# Patient Record
Sex: Female | Born: 1940 | Race: White | Hispanic: No | Marital: Single | Smoking: Never smoker
Health system: Southern US, Community
[De-identification: ages and names within clinical notes are randomized; demographics above are authoritative.]

## PROBLEM LIST (undated history)

## (undated) DIAGNOSIS — J309 Allergic rhinitis, unspecified: Secondary | ICD-10-CM

## (undated) DIAGNOSIS — K219 Gastro-esophageal reflux disease without esophagitis: Secondary | ICD-10-CM

## (undated) DIAGNOSIS — K222 Esophageal obstruction: Secondary | ICD-10-CM

## (undated) DIAGNOSIS — K5792 Diverticulitis of intestine, part unspecified, without perforation or abscess without bleeding: Secondary | ICD-10-CM

## (undated) DIAGNOSIS — E785 Hyperlipidemia, unspecified: Secondary | ICD-10-CM

## (undated) DIAGNOSIS — K317 Polyp of stomach and duodenum: Secondary | ICD-10-CM

## (undated) DIAGNOSIS — K449 Diaphragmatic hernia without obstruction or gangrene: Secondary | ICD-10-CM

## (undated) DIAGNOSIS — K5790 Diverticulosis of intestine, part unspecified, without perforation or abscess without bleeding: Secondary | ICD-10-CM

## (undated) DIAGNOSIS — I059 Rheumatic mitral valve disease, unspecified: Secondary | ICD-10-CM

## (undated) HISTORY — DX: Diaphragmatic hernia without obstruction or gangrene: K44.9

## (undated) HISTORY — PX: NASAL SEPTUM SURGERY: SHX37

## (undated) HISTORY — PX: TONSILLECTOMY AND ADENOIDECTOMY: SUR1326

## (undated) HISTORY — DX: Allergic rhinitis, unspecified: J30.9

## (undated) HISTORY — DX: Polyp of stomach and duodenum: K31.7

## (undated) HISTORY — DX: Diverticulitis of intestine, part unspecified, without perforation or abscess without bleeding: K57.92

## (undated) HISTORY — PX: PARTIAL HYSTERECTOMY: SHX80

## (undated) HISTORY — DX: Rheumatic mitral valve disease, unspecified: I05.9

## (undated) HISTORY — DX: Diverticulosis of intestine, part unspecified, without perforation or abscess without bleeding: K57.90

## (undated) HISTORY — DX: Hyperlipidemia, unspecified: E78.5

## (undated) HISTORY — DX: Esophageal obstruction: K22.2

## (undated) HISTORY — DX: Gastro-esophageal reflux disease without esophagitis: K21.9

---

## 2000-10-18 ENCOUNTER — Encounter: Payer: Self-pay | Admitting: Family Medicine

## 2000-10-18 ENCOUNTER — Encounter: Admission: RE | Admit: 2000-10-18 | Discharge: 2000-10-18 | Payer: Self-pay | Admitting: Family Medicine

## 2002-11-25 ENCOUNTER — Encounter: Admission: RE | Admit: 2002-11-25 | Discharge: 2002-11-25 | Payer: Self-pay | Admitting: *Deleted

## 2002-11-25 ENCOUNTER — Encounter: Payer: Self-pay | Admitting: Family Medicine

## 2004-01-25 ENCOUNTER — Encounter: Admission: RE | Admit: 2004-01-25 | Discharge: 2004-01-25 | Payer: Self-pay | Admitting: Family Medicine

## 2005-01-25 ENCOUNTER — Encounter: Admission: RE | Admit: 2005-01-25 | Discharge: 2005-01-25 | Payer: Self-pay | Admitting: Family Medicine

## 2006-02-18 ENCOUNTER — Encounter: Admission: RE | Admit: 2006-02-18 | Discharge: 2006-02-18 | Payer: Self-pay | Admitting: Family Medicine

## 2007-03-12 ENCOUNTER — Encounter: Admission: RE | Admit: 2007-03-12 | Discharge: 2007-03-12 | Payer: Self-pay | Admitting: Family Medicine

## 2008-03-23 ENCOUNTER — Encounter: Admission: RE | Admit: 2008-03-23 | Discharge: 2008-03-23 | Payer: Self-pay | Admitting: Family Medicine

## 2009-05-03 ENCOUNTER — Encounter: Payer: Self-pay | Admitting: Internal Medicine

## 2009-10-10 ENCOUNTER — Ambulatory Visit: Payer: Self-pay | Admitting: Internal Medicine

## 2009-10-10 DIAGNOSIS — I059 Rheumatic mitral valve disease, unspecified: Secondary | ICD-10-CM | POA: Insufficient documentation

## 2009-10-10 DIAGNOSIS — R1013 Epigastric pain: Secondary | ICD-10-CM | POA: Insufficient documentation

## 2009-10-10 DIAGNOSIS — R1031 Right lower quadrant pain: Secondary | ICD-10-CM | POA: Insufficient documentation

## 2009-10-14 ENCOUNTER — Encounter: Payer: Self-pay | Admitting: Internal Medicine

## 2009-10-18 ENCOUNTER — Telehealth: Payer: Self-pay | Admitting: Internal Medicine

## 2009-10-25 ENCOUNTER — Ambulatory Visit: Payer: Self-pay | Admitting: Internal Medicine

## 2009-10-27 ENCOUNTER — Telehealth: Payer: Self-pay | Admitting: Internal Medicine

## 2009-10-27 DIAGNOSIS — K219 Gastro-esophageal reflux disease without esophagitis: Secondary | ICD-10-CM

## 2009-10-29 ENCOUNTER — Encounter: Payer: Self-pay | Admitting: Internal Medicine

## 2009-10-31 ENCOUNTER — Encounter: Payer: Self-pay | Admitting: Internal Medicine

## 2009-11-01 ENCOUNTER — Ambulatory Visit (HOSPITAL_COMMUNITY): Admission: RE | Admit: 2009-11-01 | Discharge: 2009-11-01 | Payer: Self-pay | Admitting: Internal Medicine

## 2009-11-07 ENCOUNTER — Encounter: Payer: Self-pay | Admitting: Internal Medicine

## 2009-12-07 DIAGNOSIS — E785 Hyperlipidemia, unspecified: Secondary | ICD-10-CM

## 2009-12-07 DIAGNOSIS — Z8719 Personal history of other diseases of the digestive system: Secondary | ICD-10-CM

## 2009-12-07 DIAGNOSIS — K5730 Diverticulosis of large intestine without perforation or abscess without bleeding: Secondary | ICD-10-CM | POA: Insufficient documentation

## 2009-12-07 DIAGNOSIS — K222 Esophageal obstruction: Secondary | ICD-10-CM

## 2009-12-07 DIAGNOSIS — K5900 Constipation, unspecified: Secondary | ICD-10-CM | POA: Insufficient documentation

## 2009-12-07 DIAGNOSIS — K449 Diaphragmatic hernia without obstruction or gangrene: Secondary | ICD-10-CM | POA: Insufficient documentation

## 2009-12-14 ENCOUNTER — Ambulatory Visit: Payer: Self-pay | Admitting: Internal Medicine

## 2010-01-26 IMAGING — CR DG UGI W/ HIGH DENSITY W/KUB
1 series · 1 of 1 positions shown · IV contrast (agent unspecified)
Comparison: None available.

CLINICAL DATA: Abdominal pain. GERD.

UPPER GI SERIES W/HIGH DENSITY W/KUB
TECHNIQUE: After obtaining a scout radiograph, upper GI series
performed with high density barium and effervescent agent. Thin
barium also used.
Fluoroscopy Time: 4.2 minutes.
Contrast: Barium.

[view not recorded]
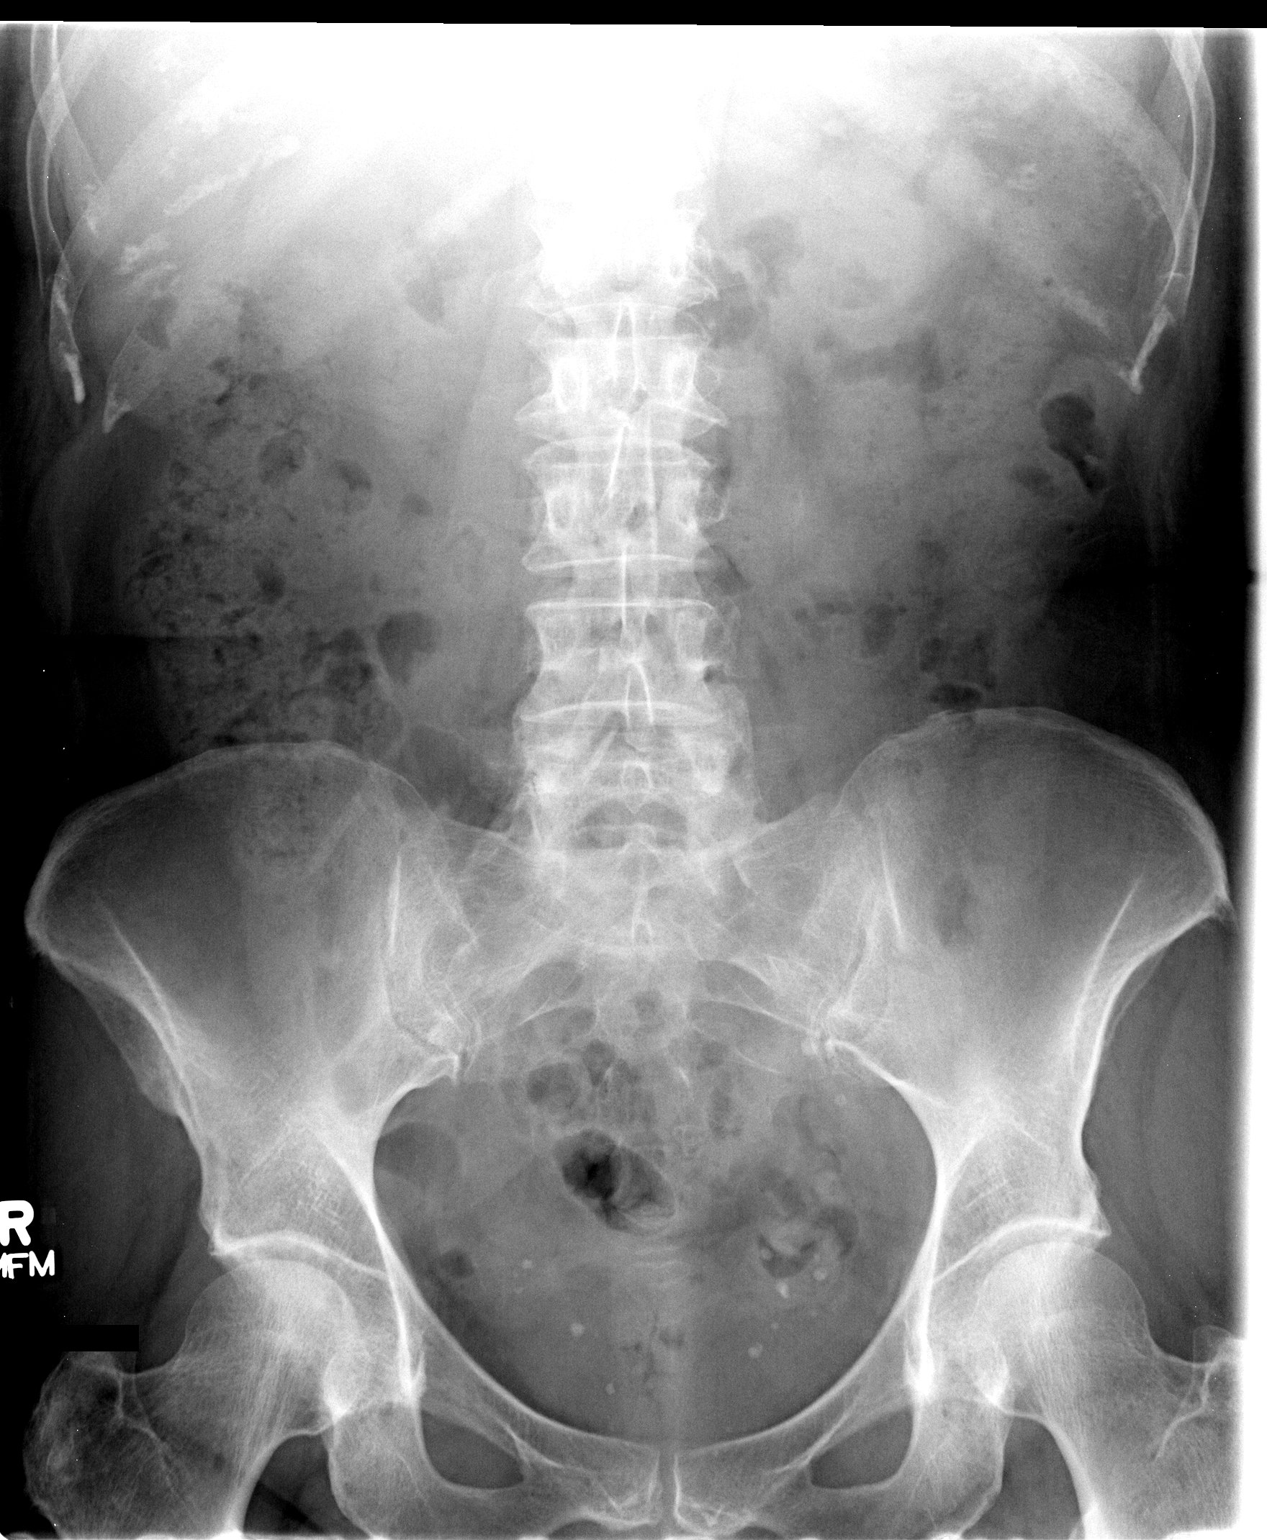

[1 of 1 positions shown; findings below may reference images not displayed]

FINDINGS: Scout film demonstrates a moderately large volume of
stool throughout the colon. No unexpected abdominal calcifications.

The esophagus appears normal without stricture, mass or evidence of
inflammatory change. Known esophageal polyps are not visualized.
Esophageal motility is normal. No hiatal hernia is seen. No
gastroesophageal reflux is elicited. 13 mm barium tablet passes
easily into the stomach. Stomach, duodenal bulb and sweep appear
normal.
IMPRESSION: Moderately large stool burden. Exam is otherwise negative.

## 2011-01-09 NOTE — Assessment & Plan Note (Signed)
Summary: F/U FROM BARIUM ESOPHAGRAM AND GERD           DEBORAH   History of Present Illness Visit Type: Follow-up Visit Primary GI MD: Lina Sar MD Primary Provider: Lucila Maine, MD Chief Complaint: F/U Bariium Esophagram & GERD has improved with medication/ diet History of Present Illness:   This is a 70 year old white female whom we have seen most recently for recurrent diverticulitis. She had a screening colonoscopy 10 years ago elsewhere apparently with findings of diverticulosis. A CT scan of the abdomen and pelvis done subsequest to new onset abdominal pain and fever confirmed inflammatory changes in the sigmoid colon. She was treated with 2 courses of Flagyl and Levaquin and subsequently Cipro with complete resolution of her symptoms. A repeat colonoscopy done in November 2010 showed mild diverticulosis in the sigmoid colon but was otherwise a normal examination. There was no evidence of acute diverticulitis and no obstruction.  Patient also had an abdominal ultrasound done in November 2010 which was negative. Patient has a history of significant gastroesophageal reflux treated with Nexium 40 mg in a.m. and Zantac 150 mg at bedtime. She underwent an upper endoscopy and esophageal dilation 9 years ago and was told to have a hiatal hernia. A repeat endoscopy done in 2010 again showed a hiatal hernia as well as multiple polyps in the fundus. Biopsies show mild chronic gastritis. There was no evidence of h. pylori, dysplasia or malignancy. Fundic gland polyps were benign. An upper GI with KUB done recently showed the esophagus to be normal without stricture, mass or evidence of inflammatory changes. Esophageal motility was normal and no hiatal hernia was seen. No gastroesophageal reflux was elicited and a 13 mm barium tablet passed easily into the stomach. The stomach, duodenal bulb and sweep appeared normal. There was a moderately large stool burden. The exam was otherwise negative. Patient comes  today for a follow up of her condition. She states that her symptoms have improved.    GI Review of Systems    Reports acid reflux.      Denies abdominal pain, belching, bloating, chest pain, dysphagia with liquids, dysphagia with solids, heartburn, loss of appetite, nausea, vomiting, vomiting blood, weight loss, and  weight gain.        Denies anal fissure, black tarry stools, change in bowel habit, constipation, diarrhea, diverticulosis, fecal incontinence, heme positive stool, hemorrhoids, irritable bowel syndrome, jaundice, light color stool, liver problems, rectal bleeding, and  rectal pain.    Current Medications (verified): 1)  Nexium 40 Mg Cpdr (Esomeprazole Magnesium) .... One Tablet By Mouth Once Daily 2)  Estrace 0.5 Mg Tabs (Estradiol) .... One Tablet By Mouth Once Daily 3)  Miacalcin 200 Unit/ml Soln (Calcitonin (Salmon)) .... One Spray Once Daily 4)  Valtrex 500 Mg Tabs (Valacyclovir Hcl) .... As Needed 5)  Red Yeast Rice 600 Mg Tabs (Red Yeast Rice Extract) .... 2 Tablets By Mouth Once Daily 6)  Citracal Plus  Tabs (Multiple Minerals-Vitamins) .... One Tablet By Mouth Two Times A Day 7)  Zantac 150 Mg Tabs (Ranitidine Hcl) .... One Tablet By Mouth At Bedtime 8)  Triple Flex Bone & Joint 750-600-100 Mg Tabs (Glucosamine-Chondroit-Calcium) .... One Tablet By Mouth Once Daily 9)  B Complex  Tabs (B Complex Vitamins) .... One Tablet By Mouth Once Daily 10)  Flax Seed Oil 1000 Mg Caps (Flaxseed (Linseed)) .... One Capsule By Mouth Once Daily 11)  Loratadine 10 Mg Tabs (Loratadine) .... One Tablet By Mouth Once Daily 12)  Probiotic Formula  Caps (Probiotic Product) .... One Capsule By Mouth Once Daily  Allergies (verified): 1)  ! Penicillin 2)  ! Erythromycin  Past History:  Past Medical History: Reviewed history from 12/07/2009 and no changes required. Diverticulosis Esophageal Stricture GERD Mitral valve prolapse  GASTRIC POLYP, HX OF (ICD-V12.79) HIATAL HERNIA  (ICD-553.3) CONSTIPATION (ICD-564.00) HYPERLIPIDEMIA (ICD-272.4) DIVERTICULOSIS OF COLON (ICD-562.10) Hx of ESOPHAGEAL STRICTURE (ICD-530.3) GERD (ICD-530.81) ABDOMINAL PAIN RIGHT LOWER QUADRANT (ICD-789.03) ABDOMINAL PAIN, EPIGASTRIC (ICD-789.06) MITRAL VALVE PROLAPSE (ICD-424.0)    Past Surgical History: Reviewed history from 12/07/2009 and no changes required. Hysterectomy-partial Tonsillectomy & Adnoidectomy Deviated septum repair  Family History: Reviewed history from 12/07/2009 and no changes required. Family History of Breast Cancer:Mother Family History of Prostate Cancer:Brother Family History of Liver Disease/Cirrhosis:Brother Family History of Heart Disease: Brother  Social History: Reviewed history from 10/10/2009 and no changes required. 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  Alcohol Use - yes Daily Caffeine Use Illicit Drug Use - no  Review of Systems       The patient complains of allergy/sinus, night sweats, sore throat, and urine leakage.         Pertinent positive and negative review of systems were noted in the above HPI. All other ROS was otherwise negative.   Vital Signs:  Patient profile:   70 year old female Height:      61 inches Weight:      138.13 pounds BMI:     26.19 Pulse rate:   60 / minute Pulse rhythm:   regular BP sitting:   110 / 66  (right arm) Cuff size:   regular  Vitals Entered By: June McMurray CMA Duncan Dull) (December 14, 2009 8:45 AM)  Physical Exam  General:  Well developed, well nourished, no acute distress. Eyes:  PERRLA, no icterus. Lungs:  Clear throughout to auscultation. Heart:  Regular rate and rhythm; no murmurs, rubs,  or bruits. Abdomen:  soft abdomen with minimal tenderness in right upper quadrant and normal liver edge at costal margin. Lower abdomen is unremarkable. There is no distention. Extremities:  No clubbing, cyanosis, edema or deformities noted. Skin:  Intact without significant  lesions or rashes. Psych:  Alert and cooperative. Normal mood and affect.   Impression & Recommendations:  Problem # 1:  GERD (ICD-530.81) Patient has chronic gastroesophageal reflux which is still somewhat symptomatic after a Nexium and Zantac regimen but much improved. Overall, the patient is satisfied with her progress. She will try to lose 10-15 lbs. We have discussed the posibility of a Nissen fundoplication. We have also discussed further gallbladder evaluation with a HIDA scan. I have offered her Reglan 5-10 mg at bedtime. She would like to first try to lose weight and continue her current regimen with dietary modifications. She will let us know if she needs further evaluation.  Problem # 2:  CONSTIPATION (ICD-564.00) Patient is status post recent colonoscopy on 10/25/09 which showed some mild diverticulosis. A recall colonoscopy will be due in 10 years.  Patient Instructions: 1)  low-fat diet. 2)  Antireflux measures. 3)  Nexium 40 mg at bedtime. 4)  Zantac 100 mg p.o. q.a.m. 5)  May take Nexium 40 mg twice a day if she has a flare up. 6)  Consider adding Reglan 5 mg at bedtime. 7)  Consider HIDA scan. 8)  Consider 24-hour pH probe for Nissen fundoplication. At this time, patient does not desire to do any more testing. 9)  Copy sent to : Dr Lucila Maine

## 2011-07-09 ENCOUNTER — Telehealth: Payer: Self-pay | Admitting: Internal Medicine

## 2011-07-09 NOTE — Telephone Encounter (Signed)
Left message for pt to call back  °

## 2011-07-09 NOTE — Telephone Encounter (Signed)
Pt states she has had several bouts with diverticulits. States she saw her PCP last week and had a CT scan and is on antibiotics now. Pt would like to be seen to see what she can do, what options she has. Pt scheduled to see Dr. Juanda Chance 07/23/11@10am . Pt aware of appt date and time. Called for records from Dr. Lucila Maine in [place], they are to fax records.

## 2011-07-23 ENCOUNTER — Encounter: Payer: Self-pay | Admitting: Internal Medicine

## 2011-07-23 ENCOUNTER — Ambulatory Visit (INDEPENDENT_AMBULATORY_CARE_PROVIDER_SITE_OTHER): Payer: Medicare Other | Admitting: Internal Medicine

## 2011-07-23 VITALS — BP 120/68 | HR 74 | Ht 61.0 in | Wt 139.0 lb

## 2011-07-23 DIAGNOSIS — K5732 Diverticulitis of large intestine without perforation or abscess without bleeding: Secondary | ICD-10-CM

## 2011-07-23 DIAGNOSIS — R933 Abnormal findings on diagnostic imaging of other parts of digestive tract: Secondary | ICD-10-CM

## 2011-07-23 NOTE — Progress Notes (Signed)
Kayla Cain 1941/01/20 MRN [id number]   History of Present Illness:  This is a 70 year old white female with recurrent diverticulitis. She has had several episodes a year for the past 3 years. This was documented on CT scans of the abdomen in 2010 which showed inflammatory changes in the left lower quadrant in the proximal sigmoid colon with inflamed diverticulum. A repeat CT scan 3 weeks ago showed minimal wall thickening of the sigmoid colon with adjacent fatty stranding consistent with diverticulitis. Her colonoscopy in November 2010 showed only mild diverticulosis. Her father had a colostomy after a perforated diverticulum. His brother died of diverticulitis; patient has had numerous courses of antibiotics. The most recent one was a19 day course of Cipro and Flagyl. She is improved but still having some pain in the lower abdomen. She denies fever, pneumaturia or rectal bleeding. She is having 6 small stools a day.   Past Medical History  Diagnosis Date  . Diverticulosis   . Esophageal stricture   . GERD (gastroesophageal reflux disease)   . Mitral valve problem   . Gastric polyp   . Hiatal hernia   . Hyperlipidemia   . Diverticulitis    Past Surgical History  Procedure Date  . Partial hysterectomy   . Tonsillectomy and adenoidectomy   . Nasal septum surgery     reports that she has never smoked. She has never used smokeless tobacco. She reports that she drinks alcohol. She reports that she does not use illicit drugs. family history includes Breast cancer in her mother; Heart disease in her brother; Liver disease in her brother; and Prostate cancer in her brother.  There is no history of Colon cancer. Allergies  Allergen Reactions  . Amitiza Nausea And Vomiting  . Erythromycin   . Macrolides And Ketolides     GI Intolerance  . Penicillins         Review of Systems: History of gastroesophageal reflux. Denies heartburn or dysphagia.  The remainder of the 10  point ROS is  negative except as outlined in H&P   Physical Exam: General appearance  Well developed in no distress. Eyes- non icteric. HEENT nontraumatic, normocephalic. Mouth no lesions, tongue papillated, no cheilosis. Neck supple without adenopathy, thyroid not enlarged, no carotid bruits, no JVD. Lungs Clear to auscultation bilaterally. Cor normal S1 normal S2, regular rhythm , no murmur,  quiet precordium. Abdomen soft but diffusely tender in the right and left lower quadrants. No rebound or fullness. Rectal: No stool in the rectum. Extremities no pedal edema. Skin no lesions. Neurological alert and oriented x 3. Psychological normal mood and affect.  Assessment and Plan:  Problem #25 70 year old white female with numerous bouts of lower abdominal pain identified as diverticulitis as per CT scans of the abdomen including the most recent scan 3 weeks ago. She has had numerous courses of antibiotics. She also has a family history of complicated diverticulitis. I think she is a good candidate for a sigmoid resection. We have discussed it and she agrees to see a Careers adviser. Interestingly, I saw only a few diverticuli in her sigmoid colon on colonoscopy.  We will obtain a barium enema to delineate her colon.  Clearly, her colon is not functioning. We will set up an appointment with a surgeon to discuss options.   07/23/2011 Lina Sar

## 2011-07-23 NOTE — Patient Instructions (Addendum)
You have been scheduled for a Barium Enema at [facility] on Thursday 07/26/11 @ 9:00 am. Please arrive at 8:30 am for registration. You should go to Outpatient Center for your test. Please make certain to go by [facility] Radiology Department in the next couple of days to get your prep and your instructions. If you need to reschedule, you may contact the hospital at [phone]. You have been scheduled for an appointment with Dr Michaell Cowing at [facility]. Your appointment is on 08/09/11 at 10:00 am. Please arrive at 10:00 am for registration. Make certain to bring a list of current medications, including any over the counter medications or vitamins. Also bring your co-pay if you have one as well as your insurance cards. [facility] is located at [address], [address]. Should you need to reschedule your appointment, please contact them at [phone]. CC: Dr Michaell Cowing, Dr Lucila Maine

## 2011-07-31 ENCOUNTER — Telehealth: Payer: Self-pay | Admitting: Internal Medicine

## 2011-07-31 NOTE — Telephone Encounter (Signed)
Results called to the pt  for BE

## 2011-08-02 ENCOUNTER — Encounter: Payer: Self-pay | Admitting: Internal Medicine

## 2011-08-03 ENCOUNTER — Telehealth (INDEPENDENT_AMBULATORY_CARE_PROVIDER_SITE_OTHER): Payer: Self-pay | Admitting: Surgery

## 2011-08-03 NOTE — Telephone Encounter (Signed)
This pt was called and instructed to pick up cd and she stated that someone from this office called her and told her that she would need the reports as well.  Kayla Cain said since we are on the same system you can pull the reports if you go under media.

## 2011-08-09 ENCOUNTER — Encounter (INDEPENDENT_AMBULATORY_CARE_PROVIDER_SITE_OTHER): Payer: Self-pay | Admitting: Surgery

## 2011-08-09 ENCOUNTER — Ambulatory Visit (INDEPENDENT_AMBULATORY_CARE_PROVIDER_SITE_OTHER): Payer: Medicare Other | Admitting: Surgery

## 2011-08-09 VITALS — BP 132/88 | HR 78 | Temp 98.0°F | Ht 61.0 in | Wt 137.2 lb

## 2011-08-09 DIAGNOSIS — Z8719 Personal history of other diseases of the digestive system: Secondary | ICD-10-CM

## 2011-08-09 DIAGNOSIS — K5730 Diverticulosis of large intestine without perforation or abscess without bleeding: Secondary | ICD-10-CM

## 2011-08-09 NOTE — Patient Instructions (Signed)
See Laparoscopic Colon & Diverticulitis Handouts

## 2011-08-09 NOTE — Progress Notes (Signed)
Subjective:     Patient ID: Kayla Cain, female   DOB: 26-Aug-1941, 70 y.o.   MRN: [id number]  HPI  Reason for visit: Recurrent diverticulitis. Consideration of partial colectomy.  Patient is a healthy woman with diverticulitis attacks for at least 3 years. This has been documented by CT scan at least once. She describes an attack as suprapubic pain that radiates to the right & left lower quadrants. It often goes to the back. Itoften happens if she is constipated. She has added fiber and Colace and her diet and has noted that her bowel movements are 2-3 times a day. She's never had a be admitted. The attacks have improved with oral antibiotics. However she is dwindling tolerance for the Cipro/Flagyl regimen. She's adjust her diet to a gluten-free diet. She notes that she feels less reflux and indigestion and gas, but she still has had attacks.  She is followed by Dr. Lina Sar. She's had endoscopies in the past which showed mild sigmoid diverticulosis only. No masses. Given the recurrent episodes of attacks, Dr. Juanda Chance recommended consideration of surgical resection. She comes today with her significant other. The patient notes that she had one female relative who required emergency surgery for diverticulitis and died. She another female relative that required emergency surgery with a colostomy and a colostomy takedown.  Past Medical History  Diagnosis Date  . Diverticulosis   . Esophageal stricture   . GERD (gastroesophageal reflux disease)   . Mitral valve problem   . Gastric polyp   . Hiatal hernia   . Hyperlipidemia   . Diverticulitis    Past Surgical History  Procedure Date  . Partial hysterectomy   . Tonsillectomy and adenoidectomy   . Nasal septum surgery    Current Outpatient Prescriptions on File Prior to Visit  Medication Sig Dispense Refill  . AMBULATORY NON FORMULARY MEDICATION Mega Red Krill 500 MG  One by mouth once daily       . calcitonin, salmon, (MIACALCIN) 200 UNIT/ACT  nasal spray Place 1 spray into the nose daily.        . Cholecalciferol (VITAMIN D) 1000 UNITS capsule Take 1,000 Units by mouth daily.        Tery Sanfilippo Sodium (COLACE PO) Take by mouth daily.        Marland Kitchen esomeprazole (NEXIUM) 40 MG capsule Take 40 mg by mouth daily before breakfast.        . estradiol (ESTRACE) 0.5 MG tablet Take 0.5 mg by mouth daily.        . fluticasone (FLONASE) 50 MCG/ACT nasal spray Place 1 spray into the nose daily.       Marland Kitchen loratadine (CLARITIN) 10 MG tablet Take 10 mg by mouth daily.        . Probiotic Product (PROBIOTIC FORMULA PO) Take 1 capsule by mouth daily.        . Red Yeast Rice Extract (RED YEAST RICE PO) Take 1 capsule by mouth daily.        . Wheat Dextrin (BENEFIBER) POWD Take by mouth daily.          Allergies  Allergen Reactions  . Amitiza Nausea And Vomiting  . Erythromycin   . Macrolides And Ketolides     GI Intolerance  . Penicillins     Review of Systems  Constitutional: Negative for fever, chills, diaphoresis, appetite change and fatigue.  HENT: Negative for ear pain, sore throat, trouble swallowing, neck pain and ear discharge.   Eyes: Negative for photophobia,  discharge and visual disturbance.  Respiratory: Negative for cough, choking, chest tightness and shortness of breath.   Cardiovascular: Negative for chest pain and palpitations.       Walk 1-2 miles without difficulty.  Gastrointestinal: Negative for nausea, vomiting, diarrhea, constipation, blood in stool, abdominal distention, anal bleeding and rectal pain.       See HPI  Genitourinary: Negative for dysuria, frequency, vaginal bleeding, vaginal discharge and difficulty urinating.  Musculoskeletal: Negative for myalgias and gait problem.  Skin: Negative for color change, pallor and rash.  Neurological: Negative for dizziness, speech difficulty, weakness and numbness.  Hematological: Negative for adenopathy.  Psychiatric/Behavioral: Negative for confusion and agitation. The patient  is not nervous/anxious.        Objective:   Physical Exam  Constitutional: She is oriented to person, place, and time. She appears well-developed and well-nourished. No distress.  HENT:  Head: Normocephalic.  Mouth/Throat: Oropharynx is clear and moist. No oropharyngeal exudate.  Eyes: Conjunctivae and EOM are normal. Pupils are equal, round, and reactive to light. No scleral icterus.  Neck: Normal range of motion. Neck supple. No tracheal deviation present.  Cardiovascular: Normal rate, regular rhythm and intact distal pulses.   Pulmonary/Chest: Effort normal and breath sounds normal. No respiratory distress. She exhibits no tenderness.  Abdominal: Soft. She exhibits no distension and no mass. There is no tenderness. There is no rebound and no guarding. Hernia confirmed negative in the right inguinal area and confirmed negative in the left inguinal area.  Genitourinary: No vaginal discharge found.  Musculoskeletal: Normal range of motion. She exhibits no tenderness.  Lymphadenopathy:    She has no cervical adenopathy.       Right: No inguinal adenopathy present.       Left: No inguinal adenopathy present.  Neurological: She is alert and oriented to person, place, and time. No cranial nerve deficit. She exhibits normal muscle tone. Coordination normal.  Skin: Skin is warm and dry. No rash noted. She is not diaphoretic. No erythema.  Psychiatric: She has a normal mood and affect. Her behavior is normal. Judgment and thought content normal.   Studies: Colonoscopy 2010, diverticulosis with no stricture  Barium enema Aug2012 showing sigmoid diverticulosis. Redundant sigmoid colon that reaches over towards the right side. No obvious intraluminal mass or stricture.    Assessment:     Recurrent sigmoid diverticulitis x3years, at least 3 attacks in the past year alone.    Plan:     Laparoscopic sigmoid colectomy:  The anatomy & physiology of the digestive tract was discussed.  The  pathophysiology was discussed.  Natural history risks without surgery was discussed.   I feel the risks of no intervention will lead to serious problems that outweigh the operative risks; therefore, I recommended a partial colectomy to remove the pathologic section of diverticuli.  Laparoscopic & open techniques were discussed. She is a good candidate for lap approach given BMI 25 and only 1 abdominal surgery.  Risks such as bleeding, infection, abscess, leak, reoperation, possible ostomy, hernia, heart attack, death, and other risks were discussed.  Goals of post-operative recovery were discussed as well.  We will work to minimize complications.  An educational handout on the pathology was given as well.  Questions were answered.  The patient & her significant other expressed understanding, desire to choose me as the surgeon, & wish to proceed with surgery.

## 2011-08-22 ENCOUNTER — Other Ambulatory Visit (INDEPENDENT_AMBULATORY_CARE_PROVIDER_SITE_OTHER): Payer: Self-pay | Admitting: Surgery

## 2011-08-22 ENCOUNTER — Encounter (HOSPITAL_COMMUNITY): Payer: Medicare Other

## 2011-08-22 LAB — CBC
Platelets: 308 10*3/uL (ref 150–400)
RBC: 4.56 MIL/uL (ref 3.87–5.11)
WBC: 8.7 10*3/uL (ref 4.0–10.5)

## 2011-08-22 LAB — COMPREHENSIVE METABOLIC PANEL
ALT: 18 U/L (ref 0–35)
AST: 21 U/L (ref 0–37)
CO2: 28 mEq/L (ref 19–32)
Chloride: 103 mEq/L (ref 96–112)
GFR calc non Af Amer: >60 mL/min (ref >60)
Sodium: 139 mEq/L (ref 135–145)
Total Bilirubin: 0.4 mg/dL (ref 0.3–1.2)

## 2011-08-28 ENCOUNTER — Inpatient Hospital Stay (HOSPITAL_COMMUNITY)
Admission: RE | Admit: 2011-08-28 | Discharge: 2011-08-31 | DRG: 331 | Disposition: A | Discharge status: Home / Self Care | Payer: Medicare Other | Source: Ambulatory Visit | Attending: Surgery | Admitting: Surgery

## 2011-08-28 ENCOUNTER — Other Ambulatory Visit (INDEPENDENT_AMBULATORY_CARE_PROVIDER_SITE_OTHER): Payer: Self-pay | Admitting: Surgery

## 2011-08-28 DIAGNOSIS — K5732 Diverticulitis of large intestine without perforation or abscess without bleeding: Principal | ICD-10-CM | POA: Diagnosis present

## 2011-08-28 DIAGNOSIS — I059 Rheumatic mitral valve disease, unspecified: Secondary | ICD-10-CM | POA: Diagnosis present

## 2011-08-28 DIAGNOSIS — K219 Gastro-esophageal reflux disease without esophagitis: Secondary | ICD-10-CM | POA: Diagnosis present

## 2011-08-28 DIAGNOSIS — Z01812 Encounter for preprocedural laboratory examination: Secondary | ICD-10-CM

## 2011-08-28 DIAGNOSIS — K449 Diaphragmatic hernia without obstruction or gangrene: Secondary | ICD-10-CM | POA: Diagnosis present

## 2011-08-28 HISTORY — PX: COLON SURGERY: SHX602

## 2011-08-28 LAB — TYPE AND SCREEN: ABO/RH(D): O POS

## 2011-08-28 LAB — ABO/RH: ABO/RH(D): O POS

## 2011-08-29 LAB — CREATININE, SERUM
Creatinine, Ser: 0.47 mg/dL — ABNORMAL LOW (ref 0.50–1.10)
GFR calc Af Amer: >60 mL/min (ref >60)
GFR calc non Af Amer: >60 mL/min (ref >60)

## 2011-08-29 LAB — CBC
HCT: 35.6 % — ABNORMAL LOW (ref 36.0–46.0)
RDW: 12.8 % (ref 11.5–15.5)
WBC: 13.6 10*3/uL — ABNORMAL HIGH (ref 4.0–10.5)

## 2011-08-31 NOTE — Op Note (Signed)
Kayla Cain, TENCH NO.:  [id number]  MEDICAL RECORD NO.:  [id number]  LOCATION:  1528                         FACILITY:  [facility]  PHYSICIAN:  Ardeth Sportsman, MD     DATE OF BIRTH:  1941-06-25  DATE OF PROCEDURE:  08/28/2011 DATE OF DISCHARGE:                              OPERATIVE REPORT   PRIMARY CARE PHYSICIAN:  Lucila Maine, MD.  GASTROENTEROLOGY:  Hedwig Morton. Juanda Chance, MD  SURGEON:  Ardeth Sportsman, MD.  ASSISTANT:  RN  PREOPERATIVE DIAGNOSIS:  Recurrent sigmoid diverticulitis.  POSTOPERATIVE DIAGNOSIS:  Recurrent sigmoid diverticulitis.  PROCEDURE PERFORMED: 1. Laparoscopic-assisted sigmoid colectomy. 2. Biopsy of splenic flexure mobilization. 3. Rigid proctoscopy.  ANESTHESIA: 1. General anesthesia. 2. Local anesthetic and field blocks.  SPECIMENS:  Sigmoid colon.  Open end is proximal.  Anastomotic rings. Blue Prolene stitches in proximal ring.  DRAINS:  None.  ESTIMATED BLOOD LOSS:  50 mL.  COMPLICATIONS:  None.  MAJOR INDICATIONS:  [name] is a pleasant 70 year old female who has had incidence of recurrent diverticulitis episodes especially in the past 3 years.  This has been documented on a CT scan.  She has been followed up Dr. Juanda Chance with this.  She is on diet modifications.  She has had better control of her bowel function as well.  She has have recurrent episodes.  She has had 3 attacks in the past year, during this past year as well.  Because of the recurring attacks, she was sent to [place] for consideration of a segmental resection.  The patient recalls at least 6 attacks.  The physiology of the digestive tract was explained.  The physiology of diverticulitis with long term risks of stricture perforation, abscess, emergency surgery, and plans were discussed.  After discussion, offer was made for partial colectomy.  Laparoscopic operative techniques were discussed.  Risks, benefits, and alternatives were discussed.   Questions answered and she agreed to proceed.  OPERATIVE FINDINGS:  She had a mildly tortuous sigmoid colon.  She had moderate sigmoid mesentery thickening consistent with mesenteric side diverticulitis.  There was no obvious cancer, tumor or metastatic disease.  DESCRIPTION OF PROCEDURE:  Informed consent was confirmed.  The patient underwent general anesthesia without any difficulty.  She was positioned in low lithotomy with the arms tucked.  She had a Foley catheter sterilely placed.  She had sequential compression devices given active during the entire case.  She had subcutaneous heparin given preoperatively.  She was placed on the anti-ileus protocol.  Her abdomen was prepped and draped in sterile fashion.  Surgical time-out confirmed our plan.  I placed a #5 mm port in the right upper quadrant using optical entry technique with the patient in steep reverse Trendelenburg and right side up.  Entry was clean.  I induced carbon dioxide insufflation.  Camera inspection revealed no injury.  Under direct visualization, I placed 5-0 ports through the umbilicus in the right lower quadrant.  Camera inspection noted a mildly tortuous sigmoid colon down in the pelvis.  I was able to reflect the small bowel out of the pelvis.  I elevated the sigmoid mesentry.  It was mildly thickened on the antimesenteric side.  I  scored the mesentry from the midrectum up towards the ligament of Treitz.  I was able to elevate the sigmoid mesentery anteriorly and get to the retromesenteric space.  I could see the left ureter and go down the vessels and kept those posterior.  I did further dissection and mobilize the medial and lateral fascia of the left colon.  I came down to skeletonize the mesentery down towards the inferior mesenteric artery, but I did not ligate it.  Next, I mobilized the colon in a lateral to medial fashion.  I started to release the sigmoid mesentery off the adhesions over the  pelvic brim. I was able to come up and mobilize up to the splenic flexure.  Again, I was able to mobilize laterally on the left distal sigmoid colon and proximal rectum.  I  could see the ureter and kept medial to those, taken away from this.  I avoided dissection on the lateral stalk. Hemostasis was good.  I made a 6 cm incision in her prior Pfannenstiel incision and a wound protector there.  I can find the rectosigmoid junction and made a window there.  I transected the sigmoid colon off the rectum using a Contour stapler.  I took the proximal mesorectum medially towards the sigmoid so I can eviscerate up towards descending colon.  At the descending/sigmoid colon junction, the area was more soft.  She had mild to moderately thickened sigmoid mesentery.  This seemed consistent more with diverticulitis that was more in the mesenteric side than the antimesenteric side.  When it ended small bowel clamping was done proximally.  I transected at the sigmoid/descending junction using a scalpel to healthy bleeding mucosa and good healthy mesentery applying good blood supply.  I did try to stay close to the sigmoid colon and not take too much mesentery.  I could see the ureter and stayed away from this at this time.  I used sizers and the 33 EEA sizer easily fit through.  I placed an anvil of 33 EEA stapler in the descending colon side and closed around the anvil using a 0-Prolene purse-string stitch and tied that down.  I scrubbed down and did general anal dilation with fingers in the EEA size until I could place the EEA stapler up.  I could see the vaginal cuff and was able to bring the rectal stump posterior to that.  I brought the spike out anterior to the stapler line on the antimesenteric side.  I attached the anvil to spike the stapler and clamped that down. I checked to make sure there was no involvement of any contagious sources.  We were cephalad to the vaginal cuff and that was  not involved.  I held the stapler clamp for 45 seconds.  I fired and held it for 30 seconds and released it.  I had 2 excellent anastomotic rings.  I did a rigid proctoscopy.  I could see the anastomotic line at 13 cm from the anal verge.  Hemostasis was excellent.  I did insufflation with the descending colon clamped proximally under water. There was no leaking of bubbles.  That was consistent with a negative air-leak test. I rechecked the hemostasis, it was good in the rectum itself.  I desufflated the colon and rectum.  I scrubbed and looked back at the abdomen.  Hemostasis was good in the pelvis.  I clamped down on the wound protector and did diagnostic laparoscopy.  With the patient head down, there was some mild tension  at the anastomosis.  I mobilized the splenic flexure and the colon turning the distal transverse colon and freeing the greater omentum off the distal transverse colon and coming around on the posterior attachments in the posterior splenocolic attachment until that came down well.  I freed off some attachments to the left spleen as well.  There was some mild tension at the IMA and IMV, but the bowel lumen itself did not have any tension at the anastomosis and looked much softer.  Hemostasis was excellent and the anastomosis was viable.  The small bowel otherwise looked fine.  I did irrigation of the pelvis with clear return.  I evacuated the perineum and removed the 5 mm ports.  I closed the Pfannenstiel wound using 0 Vicryl stitches on the posterior rectus fascia in the midline and the anterior rectus fascia transversely using #1 loop PDS.  I closed the skin using 4-0 Monocryl stitches.  I placed some umbilical tape wicks in the Pfannenstiel wound with dry sterile dressing.  The patient was extubated, returned to the recovery room in stable condition.  I discussed postop goals with the patient.  I discussed postop findings with the patient's  family.     Ardeth Sportsman, MD     SCG/MEDQ  D:  08/28/2011  T:  08/28/2011  Job:  [id number]  cc:   Lucila Maine, MD Fax: [phone]  Hedwig Morton. Juanda Chance, MD [address] [place] [address]  Electronically Signed by Karie Soda MD on 08/31/2011 02:20:10 PM

## 2011-09-03 ENCOUNTER — Telehealth (INDEPENDENT_AMBULATORY_CARE_PROVIDER_SITE_OTHER): Payer: Self-pay

## 2011-09-03 NOTE — Telephone Encounter (Signed)
Patient called about her incision that has blisters around it where the tape was.  Dr Michaell Cowing drained them in the hospital.  She is still keeping it covered at times but the bandage sticks to it.  After consulting with Ethlyn Gallery, we advised she try a little Neosporin and a non stick pad and see how that works.

## 2011-09-05 NOTE — Discharge Summary (Signed)
  Kayla Cain, JANUARY NO.:  [id number]  MEDICAL RECORD NO.:  [id number]  LOCATION:  1528                         FACILITY:  [facility]  PHYSICIAN:  Ardeth Sportsman, MD     DATE OF BIRTH:  1941/11/18  DATE OF ADMISSION:  08/28/2011 DATE OF DISCHARGE:  08/31/2011                              DISCHARGE SUMMARY   PRIMARY CARE PHYSICIAN:  Lucila Maine, MD.  GASTROENTEROLOGIST:  Hedwig Morton. Juanda Chance, MD.  SURGEON:  Ardeth Sportsman, MD.  PRINCIPAL FINAL DISCHARGE DIAGNOSIS:  Recurrent sigmoid diverticulitis.  PROCEDURE PERFORMED:  Laparoscopic assisted splenic flexure mobilization and sigmoid colectomy with rigid proctoscopy on August 28, 2011.  OTHER DIAGNOSES: 1. Gastroesophageal reflux disease with esophageal strictures. 2. Mitral valve prolapse. 3. Gastric polyp. 4. Hiatal hernia. 5. Hyperlipidemia. 6. Status post partial hysterectomy. 7. Status post tonsillectomy. 8. Adenoidectomy in the past. 9. Nasal symptom surgery.  MEDICATIONS:  Noted in the system include: 1. __________ 500 mg daily. 2. Miacalcin 200 units nasal spray daily. 3. Vitamin D 1000 units daily. 4. Colace daily. 5. Nexium 40 mg daily. 6. Estradiol 0.5 mg p.o. daily. 7. Flonase 1 spray daily. 8. Loratadine 10 mg daily p.r.n. 9. Red yeast rice  __________ and Benefiber daily.  Pathology shows diverticulosis.  No cancer or tumor.  HOSPITAL COURSE:  [name] is a 70 year old female who has struggled with numerous diverticulitis attacks especially in the past 3 years. She has had 3 in 2012.  She was sent to me by Dr. Juanda Chance for the recurrent attacks.  She underwent laparoscopic-assisted resection.  She was placed on the anti-ileus protocol.  She was started on sips of liquid and by the time of discharge was tolerating solid foods and liquids.  She had had a bowel movement.  She had flatus.  Her pain was controlled with oral pain medications.  She was walking well in the hallways.  Based  on improvements I think it would be reasonable for her to be discharged home with the following instructions: 1. She is to return to clinic to see me in 1 to 2 weeks. 2. She should call if she has any fever, chills, sweat, nausea,     vomiting, uncontrolled pain, diarrhea, bleeding, blistering,     drainage from her incision or other concerns. 3. She should resume her home medications as noted above. 4. She should use ice pack, heating pads, warm showers, Tylenol over-     the-counter p.r.n., Aleve over-the-counter p.r.n., and oxycodone 5     to 10 mg p.o. q. 4 h. p.r.n. pain in combination to help control     her pain. 5. She should use some type of bowel regimen whether to continue her     Benefiber versus switching to MiraLax, which we had discussed.  The     goal is one soft bowel movement a day.     Ardeth Sportsman, MD     SCG/MEDQ  D:  08/31/2011  T:  08/31/2011  Job:  [id number]  Electronically Signed by Karie Soda MD on 09/05/2011 08:39:48 AM

## 2011-09-12 ENCOUNTER — Encounter (INDEPENDENT_AMBULATORY_CARE_PROVIDER_SITE_OTHER): Payer: Self-pay | Admitting: Surgery

## 2011-09-12 ENCOUNTER — Ambulatory Visit (INDEPENDENT_AMBULATORY_CARE_PROVIDER_SITE_OTHER): Payer: Medicare Other | Admitting: Surgery

## 2011-09-12 DIAGNOSIS — K5730 Diverticulosis of large intestine without perforation or abscess without bleeding: Secondary | ICD-10-CM

## 2011-09-12 NOTE — Progress Notes (Signed)
Subjective:     Patient ID: Kayla Cain, female   DOB: [birth year], 70 y.o.   MRN: [id number]  HPI  Patient Care Team: Heywood Bene as PCP - General (Unknown Physician Specialty) Hart Carwin, MD as Consulting Physician (Gastroenterology)  This patient is a 70 y.o.female who presents today for surgical evaluation.   Diagnosis: Recurrent diverticulitis  Procedure: Laparoscopically assisted sigmoid colectomy 08/28/2011  Pathology: Diverticulosis. No cancer.  The patient comes in today feeling quite well. Minimal soreness at the suprapubic Pfannenstiel incision. Eating well.  2-3 bowel movements a day. No fevers chills or sweats. Energy improving. She comes today with her husband. She is wanting to be more active. She is walking well. No drainage from her incision.   Past Medical History  Diagnosis Date  . Diverticulosis   . Esophageal stricture   . GERD (gastroesophageal reflux disease)   . Mitral valve problem     Followed by Dr. Myrtis Ser briefly.  Did not tolertate B blocker.  Murmur disappeared & now off cardiac meds w/o symptoms  . Gastric polyp   . Hiatal hernia   . Hyperlipidemia   . Diverticulitis     Past Surgical History  Procedure Date  . Partial hysterectomy   . Tonsillectomy and adenoidectomy   . Nasal septum surgery   . Colon surgery 08/28/11    sigmoid colectomy -diverticulitis    History   Social History  . Marital Status: Single    Spouse Name: N/A    Number of Children: N/A  . Years of Education: N/A   Occupational History  . [employer]     Social History Main Topics  . Smoking status: Never Smoker   . Smokeless tobacco: Never Used  . Alcohol Use: Yes     rare   . Drug Use: No  . Sexually Active: Not on file   Other Topics Concern  . Not on file   Social History Narrative  . No narrative on file    Family History  Problem Relation Age of Onset  . Breast cancer Mother   . Cancer Mother     breast  . Prostate cancer Brother   . Liver  disease Brother   . Heart disease Brother   . Colon cancer Neg Hx     Current outpatient prescriptions:AMBULATORY NON FORMULARY MEDICATION, Mega Red Krill 500 MG  One by mouth once daily , Disp: , Rfl: ;  calcitonin, salmon, (MIACALCIN) 200 UNIT/ACT nasal spray, Place 1 spray into the nose daily.  , Disp: , Rfl: ;  Cholecalciferol (VITAMIN D) 1000 UNITS capsule, Take 1,000 Units by mouth daily.  , Disp: , Rfl: ;  Docusate Sodium (COLACE PO), Take by mouth daily.  , Disp: , Rfl:  esomeprazole (NEXIUM) 40 MG capsule, Take 40 mg by mouth daily before breakfast.  , Disp: , Rfl: ;  estradiol (ESTRACE) 0.5 MG tablet, Take 0.5 mg by mouth daily.  , Disp: , Rfl: ;  fluticasone (FLONASE) 50 MCG/ACT nasal spray, Place 1 spray into the nose daily. , Disp: , Rfl: ;  loratadine (CLARITIN) 10 MG tablet, Take 10 mg by mouth daily.  , Disp: , Rfl: ;  Probiotic Product (PROBIOTIC FORMULA PO), Take 1 capsule by mouth daily.  , Disp: , Rfl:  Red Yeast Rice Extract (RED YEAST RICE PO), Take 1 capsule by mouth daily.  , Disp: , Rfl: ;  Wheat Dextrin (BENEFIBER) POWD, Take by mouth daily.  , Disp: , Rfl:  Allergies  Allergen Reactions  . Amitiza Nausea And Vomiting  . Erythromycin   . Macrolides And Ketolides     GI Intolerance  . Penicillins        Review of Systems  Constitutional: Negative for fever, chills, diaphoresis, appetite change and fatigue.  HENT: Negative for ear pain, sore throat, trouble swallowing, neck pain and ear discharge.   Eyes: Negative for photophobia, discharge and visual disturbance.  Respiratory: Negative for cough, choking, chest tightness and shortness of breath.   Cardiovascular: Negative for chest pain and palpitations.  Gastrointestinal: Positive for abdominal pain. Negative for nausea, vomiting, diarrhea, constipation, blood in stool, abdominal distention, anal bleeding and rectal pain.  Genitourinary: Negative for dysuria, frequency and difficulty urinating.    Musculoskeletal: Negative for myalgias and gait problem.  Skin: Negative for color change, pallor and rash.  Neurological: Negative for dizziness, speech difficulty, weakness and numbness.  Hematological: Negative for adenopathy.  Psychiatric/Behavioral: Negative for confusion and agitation. The patient is not nervous/anxious.        Objective:   Physical Exam  Constitutional: She is oriented to person, place, and time. She appears well-developed and well-nourished. No distress.  HENT:  Head: Normocephalic.  Mouth/Throat: Oropharynx is clear and moist. No oropharyngeal exudate.  Eyes: Conjunctivae and EOM are normal. Pupils are equal, round, and reactive to light. No scleral icterus.  Neck: Normal range of motion. Neck supple. No tracheal deviation present.  Cardiovascular: Normal rate, regular rhythm and intact distal pulses.   Pulmonary/Chest: Effort normal and breath sounds normal. No respiratory distress. She exhibits no tenderness.  Abdominal: Soft. She exhibits no distension and no mass. There is no tenderness. There is no guarding. Hernia confirmed negative in the right inguinal area and confirmed negative in the left inguinal area.       Incisions clean with normal healing ridges.  No hernias  Musculoskeletal: Normal range of motion. She exhibits no tenderness.  Lymphadenopathy:    She has no cervical adenopathy.       Right: No inguinal adenopathy present.       Left: No inguinal adenopathy present.  Neurological: She is alert and oriented to person, place, and time. No cranial nerve deficit. She exhibits normal muscle tone. Coordination normal.  Skin: Skin is warm and dry. No rash noted. She is not diaphoretic. No erythema.  Psychiatric: She has a normal mood and affect. Her behavior is normal. Judgment and thought content normal.       Assessment:     POD# 15 lap sigmoid colectomy, recovering well    Plan:     i noted I think she is doing amazingly well for only 2  weeks out from colon surgery. She and her husband feel reassured.  Increase activity as tolerated.  Do not push through pain.  Advanced on diet as tolerated. Bowel regimen to avoid problems.Try to avoid foods that triggered prior attacks. She withdrew well to avoid raw particulate matter getting into the colon and causing further problems.  Return to clinic 3 weeks. The patient expressed understanding and appreciation.

## 2011-10-03 ENCOUNTER — Ambulatory Visit (INDEPENDENT_AMBULATORY_CARE_PROVIDER_SITE_OTHER): Payer: Medicare Other | Admitting: Surgery

## 2011-10-03 ENCOUNTER — Encounter (INDEPENDENT_AMBULATORY_CARE_PROVIDER_SITE_OTHER): Payer: Self-pay | Admitting: Surgery

## 2011-10-03 VITALS — BP 116/76 | HR 68 | Temp 96.9°F | Resp 14 | Ht 61.0 in | Wt 134.8 lb

## 2011-10-03 DIAGNOSIS — K5730 Diverticulosis of large intestine without perforation or abscess without bleeding: Secondary | ICD-10-CM

## 2011-10-03 NOTE — Patient Instructions (Signed)

## 2011-10-03 NOTE — Progress Notes (Signed)
Subjective:     Patient ID: Kayla Cain, female   DOB: May 30, 1941, 70 y.o.   MRN: [id number]  HPI  Patient Care Team: Heywood Bene as PCP - General (Unknown Physician Specialty) Hart Carwin, MD as Consulting Physician (Gastroenterology)  This patient is a 70 y.o.female who presents today for surgical evaluation.   Procedure: Left assisted sigmoid colectomy 08/28/2011  Patient comes in today feeling well. Loose bowel movements. About 4-6 a day. She was doing 2-3 preoperatively. Stopped Colace. Continues on mild fiber. Soreness down. Energy level slowly returning. She comes today with her husband. Mild soreness right lower quadrant nearly resolved. No bleeding. No episodes of diverticulitis. No fevers chills  nor sweats  Past Medical History  Diagnosis Date  . Diverticulosis   . Esophageal stricture   . GERD (gastroesophageal reflux disease)   . Mitral valve problem     Followed by Dr. Myrtis Ser briefly.  Did not tolertate B blocker.  Murmur disappeared & now off cardiac meds w/o symptoms  . Gastric polyp   . Hiatal hernia   . Hyperlipidemia   . Diverticulitis     Past Surgical History  Procedure Date  . Partial hysterectomy   . Tonsillectomy and adenoidectomy   . Nasal septum surgery   . Colon surgery 08/28/11    lap sigmoid colectomy -diverticulitis    History   Social History  . Marital Status: Single    Spouse Name: N/A    Number of Children: N/A  . Years of Education: N/A   Occupational History  . [employer]     Social History Main Topics  . Smoking status: Never Smoker   . Smokeless tobacco: Never Used  . Alcohol Use: Yes     rare   . Drug Use: No  . Sexually Active: Not on file   Other Topics Concern  . Not on file   Social History Narrative  . No narrative on file    Family History  Problem Relation Age of Onset  . Breast cancer Mother   . Cancer Mother     breast  . Prostate cancer Brother   . Liver disease Brother   . Heart disease Brother     . Colon cancer Neg Hx     Current outpatient prescriptions:AMBULATORY NON FORMULARY MEDICATION, Mega Red Krill 500 MG  One by mouth once daily , Disp: , Rfl: ;  calcitonin, salmon, (MIACALCIN) 200 UNIT/ACT nasal spray, Place 1 spray into the nose daily.  , Disp: , Rfl: ;  Cholecalciferol (VITAMIN D) 1000 UNITS capsule, Take 1,000 Units by mouth daily.  , Disp: , Rfl: ;  Docusate Sodium (COLACE PO), Take by mouth daily.  , Disp: , Rfl:  esomeprazole (NEXIUM) 40 MG capsule, Take 40 mg by mouth daily before breakfast.  , Disp: , Rfl: ;  estradiol (ESTRACE) 0.5 MG tablet, Take 0.5 mg by mouth daily.  , Disp: , Rfl: ;  fluticasone (FLONASE) 50 MCG/ACT nasal spray, Place 1 spray into the nose daily. , Disp: , Rfl: ;  loratadine (CLARITIN) 10 MG tablet, Take 10 mg by mouth daily.  , Disp: , Rfl: ;  Probiotic Product (PROBIOTIC FORMULA PO), Take 1 capsule by mouth daily.  , Disp: , Rfl:  Red Yeast Rice Extract (RED YEAST RICE PO), Take 1 capsule by mouth daily.  , Disp: , Rfl: ;  Wheat Dextrin (BENEFIBER) POWD, Take by mouth daily.  , Disp: , Rfl:   Allergies  Allergen Reactions  .  Penicillins Rash    All over the body  . Amitiza Nausea And Vomiting  . Macrolides And Ketolides     GI Intolerance  . Erythromycin Nausea Only       Review of Systems  Constitutional: Negative for fever, chills and diaphoresis.  HENT: Negative for ear pain, sore throat and trouble swallowing.   Eyes: Negative for photophobia and visual disturbance.  Respiratory: Negative for cough and choking.   Cardiovascular: Negative for chest pain and palpitations.  Gastrointestinal: Positive for diarrhea. Negative for nausea, vomiting, abdominal pain, constipation, blood in stool, anal bleeding and rectal pain.  Genitourinary: Negative for dysuria, frequency and difficulty urinating.  Musculoskeletal: Negative for myalgias and gait problem.  Skin: Negative for color change, pallor and rash.  Neurological: Negative for  dizziness, speech difficulty, weakness and numbness.  Hematological: Negative for adenopathy.  Psychiatric/Behavioral: Negative for confusion and agitation. The patient is not nervous/anxious.        Objective:   Physical Exam  Constitutional: She is oriented to person, place, and time. She appears well-developed and well-nourished. No distress.  HENT:  Head: Normocephalic.  Mouth/Throat: Oropharynx is clear and moist. No oropharyngeal exudate.  Eyes: Conjunctivae and EOM are normal. Pupils are equal, round, and reactive to light. No scleral icterus.  Neck: Normal range of motion. No tracheal deviation present.  Cardiovascular: Normal rate and intact distal pulses.   Pulmonary/Chest: Effort normal. No respiratory distress. She exhibits no tenderness.  Abdominal: Soft. She exhibits no distension. There is no tenderness. Hernia confirmed negative in the right inguinal area and confirmed negative in the left inguinal area.       Incisions clean with normal healing ridges.  No hernias  Genitourinary: No vaginal discharge found.  Musculoskeletal: Normal range of motion. She exhibits no tenderness.  Lymphadenopathy:       Right: No inguinal adenopathy present.       Left: No inguinal adenopathy present.  Neurological: She is alert and oriented to person, place, and time. No cranial nerve deficit. She exhibits normal muscle tone. Coordination normal.  Skin: Skin is warm and dry. No rash noted. She is not diaphoretic.  Psychiatric: She has a normal mood and affect. Her behavior is normal.       Assessment:     One month s/p lap sigmoid colectomy recovering well.     Plan:     Increase activity as tolerated.  Do not push through pain.  Advanced on diet as tolerated. Bowel regimen to avoid problems.  Switch to Kayopectate BID.  Imodium PRN severe episodes  Return to clinic p.r.n. The patient expressed understanding and appreciation

## 2011-12-20 DIAGNOSIS — R1011 Right upper quadrant pain: Secondary | ICD-10-CM | POA: Diagnosis not present

## 2011-12-24 DIAGNOSIS — R109 Unspecified abdominal pain: Secondary | ICD-10-CM | POA: Diagnosis not present

## 2011-12-24 DIAGNOSIS — I77811 Abdominal aortic ectasia: Secondary | ICD-10-CM | POA: Diagnosis not present

## 2011-12-28 DIAGNOSIS — R1011 Right upper quadrant pain: Secondary | ICD-10-CM | POA: Diagnosis not present

## 2012-06-02 DIAGNOSIS — H6980 Other specified disorders of Eustachian tube, unspecified ear: Secondary | ICD-10-CM | POA: Diagnosis not present

## 2012-06-02 DIAGNOSIS — M26629 Arthralgia of temporomandibular joint, unspecified side: Secondary | ICD-10-CM | POA: Diagnosis not present

## 2012-06-02 DIAGNOSIS — J310 Chronic rhinitis: Secondary | ICD-10-CM | POA: Diagnosis not present

## 2012-07-18 DIAGNOSIS — L719 Rosacea, unspecified: Secondary | ICD-10-CM | POA: Diagnosis not present

## 2012-07-18 DIAGNOSIS — L821 Other seborrheic keratosis: Secondary | ICD-10-CM | POA: Diagnosis not present

## 2012-09-15 DIAGNOSIS — R1084 Generalized abdominal pain: Secondary | ICD-10-CM | POA: Diagnosis not present

## 2012-09-17 DIAGNOSIS — R1084 Generalized abdominal pain: Secondary | ICD-10-CM | POA: Diagnosis not present

## 2012-10-30 DIAGNOSIS — R1011 Right upper quadrant pain: Secondary | ICD-10-CM | POA: Diagnosis not present

## 2012-11-11 DIAGNOSIS — Z23 Encounter for immunization: Secondary | ICD-10-CM | POA: Diagnosis not present

## 2012-12-01 DIAGNOSIS — J00 Acute nasopharyngitis [common cold]: Secondary | ICD-10-CM | POA: Diagnosis not present

## 2013-01-26 DIAGNOSIS — J310 Chronic rhinitis: Secondary | ICD-10-CM | POA: Diagnosis not present

## 2013-01-26 DIAGNOSIS — E782 Mixed hyperlipidemia: Secondary | ICD-10-CM | POA: Diagnosis not present

## 2013-01-26 DIAGNOSIS — K5732 Diverticulitis of large intestine without perforation or abscess without bleeding: Secondary | ICD-10-CM | POA: Diagnosis not present

## 2013-01-26 DIAGNOSIS — M948X9 Other specified disorders of cartilage, unspecified sites: Secondary | ICD-10-CM | POA: Diagnosis not present

## 2013-01-26 DIAGNOSIS — Z79899 Other long term (current) drug therapy: Secondary | ICD-10-CM | POA: Diagnosis not present

## 2013-01-29 DIAGNOSIS — H04129 Dry eye syndrome of unspecified lacrimal gland: Secondary | ICD-10-CM | POA: Diagnosis not present

## 2013-01-29 DIAGNOSIS — H35359 Cystoid macular degeneration, unspecified eye: Secondary | ICD-10-CM | POA: Diagnosis not present

## 2013-02-04 ENCOUNTER — Encounter (INDEPENDENT_AMBULATORY_CARE_PROVIDER_SITE_OTHER): Payer: Medicare Other | Admitting: Ophthalmology

## 2013-02-04 DIAGNOSIS — H43819 Vitreous degeneration, unspecified eye: Secondary | ICD-10-CM

## 2013-02-04 DIAGNOSIS — H3530 Unspecified macular degeneration: Secondary | ICD-10-CM

## 2013-02-05 DIAGNOSIS — R1013 Epigastric pain: Secondary | ICD-10-CM | POA: Diagnosis not present

## 2013-02-05 DIAGNOSIS — K5732 Diverticulitis of large intestine without perforation or abscess without bleeding: Secondary | ICD-10-CM | POA: Diagnosis not present

## 2013-03-24 DIAGNOSIS — Z1231 Encounter for screening mammogram for malignant neoplasm of breast: Secondary | ICD-10-CM | POA: Diagnosis not present

## 2013-03-26 DIAGNOSIS — H04129 Dry eye syndrome of unspecified lacrimal gland: Secondary | ICD-10-CM | POA: Diagnosis not present

## 2013-05-21 DIAGNOSIS — L821 Other seborrheic keratosis: Secondary | ICD-10-CM | POA: Diagnosis not present

## 2013-05-21 DIAGNOSIS — L819 Disorder of pigmentation, unspecified: Secondary | ICD-10-CM | POA: Diagnosis not present

## 2013-05-21 DIAGNOSIS — D2370 Other benign neoplasm of skin of unspecified lower limb, including hip: Secondary | ICD-10-CM | POA: Diagnosis not present

## 2013-05-21 DIAGNOSIS — D235 Other benign neoplasm of skin of trunk: Secondary | ICD-10-CM | POA: Diagnosis not present

## 2013-05-21 DIAGNOSIS — L719 Rosacea, unspecified: Secondary | ICD-10-CM | POA: Diagnosis not present

## 2013-05-21 DIAGNOSIS — L608 Other nail disorders: Secondary | ICD-10-CM | POA: Diagnosis not present

## 2013-05-21 DIAGNOSIS — D239 Other benign neoplasm of skin, unspecified: Secondary | ICD-10-CM | POA: Diagnosis not present

## 2013-07-02 DIAGNOSIS — H04129 Dry eye syndrome of unspecified lacrimal gland: Secondary | ICD-10-CM | POA: Diagnosis not present

## 2013-09-18 DIAGNOSIS — J0100 Acute maxillary sinusitis, unspecified: Secondary | ICD-10-CM | POA: Diagnosis not present

## 2013-11-23 DIAGNOSIS — Z23 Encounter for immunization: Secondary | ICD-10-CM | POA: Diagnosis not present

## 2014-05-31 DIAGNOSIS — Z79899 Other long term (current) drug therapy: Secondary | ICD-10-CM | POA: Diagnosis not present

## 2014-05-31 DIAGNOSIS — E7800 Pure hypercholesterolemia, unspecified: Secondary | ICD-10-CM | POA: Diagnosis not present

## 2014-06-03 DIAGNOSIS — Z23 Encounter for immunization: Secondary | ICD-10-CM | POA: Diagnosis not present

## 2014-06-03 DIAGNOSIS — J310 Chronic rhinitis: Secondary | ICD-10-CM | POA: Diagnosis not present

## 2014-06-03 DIAGNOSIS — K5730 Diverticulosis of large intestine without perforation or abscess without bleeding: Secondary | ICD-10-CM | POA: Diagnosis not present

## 2014-06-03 DIAGNOSIS — E782 Mixed hyperlipidemia: Secondary | ICD-10-CM | POA: Diagnosis not present

## 2014-06-03 DIAGNOSIS — N951 Menopausal and female climacteric states: Secondary | ICD-10-CM | POA: Diagnosis not present

## 2014-06-03 DIAGNOSIS — Z0000 Encounter for general adult medical examination without abnormal findings: Secondary | ICD-10-CM | POA: Diagnosis not present

## 2014-06-03 DIAGNOSIS — Z780 Asymptomatic menopausal state: Secondary | ICD-10-CM | POA: Diagnosis not present

## 2014-06-25 DIAGNOSIS — K5732 Diverticulitis of large intestine without perforation or abscess without bleeding: Secondary | ICD-10-CM | POA: Diagnosis not present

## 2014-06-25 DIAGNOSIS — R1031 Right lower quadrant pain: Secondary | ICD-10-CM | POA: Diagnosis not present

## 2014-06-25 DIAGNOSIS — M5430 Sciatica, unspecified side: Secondary | ICD-10-CM | POA: Diagnosis not present

## 2014-07-01 DIAGNOSIS — L738 Other specified follicular disorders: Secondary | ICD-10-CM | POA: Diagnosis not present

## 2014-07-01 DIAGNOSIS — L821 Other seborrheic keratosis: Secondary | ICD-10-CM | POA: Diagnosis not present

## 2014-07-01 DIAGNOSIS — D239 Other benign neoplasm of skin, unspecified: Secondary | ICD-10-CM | POA: Diagnosis not present

## 2014-07-01 DIAGNOSIS — D2370 Other benign neoplasm of skin of unspecified lower limb, including hip: Secondary | ICD-10-CM | POA: Diagnosis not present

## 2014-07-01 DIAGNOSIS — L819 Disorder of pigmentation, unspecified: Secondary | ICD-10-CM | POA: Diagnosis not present

## 2014-07-01 DIAGNOSIS — L820 Inflamed seborrheic keratosis: Secondary | ICD-10-CM | POA: Diagnosis not present

## 2014-07-01 DIAGNOSIS — D1801 Hemangioma of skin and subcutaneous tissue: Secondary | ICD-10-CM | POA: Diagnosis not present

## 2014-07-05 DIAGNOSIS — H26499 Other secondary cataract, unspecified eye: Secondary | ICD-10-CM | POA: Diagnosis not present

## 2014-07-14 DIAGNOSIS — Z1231 Encounter for screening mammogram for malignant neoplasm of breast: Secondary | ICD-10-CM | POA: Diagnosis not present

## 2014-08-25 DIAGNOSIS — H43819 Vitreous degeneration, unspecified eye: Secondary | ICD-10-CM | POA: Diagnosis not present

## 2014-08-25 DIAGNOSIS — H0019 Chalazion unspecified eye, unspecified eyelid: Secondary | ICD-10-CM | POA: Diagnosis not present

## 2014-09-01 DIAGNOSIS — H0019 Chalazion unspecified eye, unspecified eyelid: Secondary | ICD-10-CM | POA: Diagnosis not present

## 2014-09-21 DIAGNOSIS — J0100 Acute maxillary sinusitis, unspecified: Secondary | ICD-10-CM | POA: Diagnosis not present

## 2014-10-29 DIAGNOSIS — Z23 Encounter for immunization: Secondary | ICD-10-CM | POA: Diagnosis not present

## 2014-12-21 DIAGNOSIS — H04123 Dry eye syndrome of bilateral lacrimal glands: Secondary | ICD-10-CM | POA: Diagnosis not present

## 2014-12-28 DIAGNOSIS — H04123 Dry eye syndrome of bilateral lacrimal glands: Secondary | ICD-10-CM | POA: Diagnosis not present

## 2015-03-31 DIAGNOSIS — J0101 Acute recurrent maxillary sinusitis: Secondary | ICD-10-CM | POA: Diagnosis not present

## 2015-07-07 DIAGNOSIS — L821 Other seborrheic keratosis: Secondary | ICD-10-CM | POA: Diagnosis not present

## 2015-07-07 DIAGNOSIS — D225 Melanocytic nevi of trunk: Secondary | ICD-10-CM | POA: Diagnosis not present

## 2015-07-07 DIAGNOSIS — L819 Disorder of pigmentation, unspecified: Secondary | ICD-10-CM | POA: Diagnosis not present

## 2015-08-31 DIAGNOSIS — Z1231 Encounter for screening mammogram for malignant neoplasm of breast: Secondary | ICD-10-CM | POA: Diagnosis not present

## 2015-10-12 ENCOUNTER — Encounter: Payer: Self-pay | Admitting: Internal Medicine

## 2015-10-13 DIAGNOSIS — H524 Presbyopia: Secondary | ICD-10-CM | POA: Diagnosis not present

## 2015-10-13 DIAGNOSIS — H353131 Nonexudative age-related macular degeneration, bilateral, early dry stage: Secondary | ICD-10-CM | POA: Diagnosis not present

## 2015-10-13 DIAGNOSIS — H04123 Dry eye syndrome of bilateral lacrimal glands: Secondary | ICD-10-CM | POA: Diagnosis not present

## 2015-11-16 DIAGNOSIS — H43393 Other vitreous opacities, bilateral: Secondary | ICD-10-CM | POA: Diagnosis not present

## 2015-11-21 DIAGNOSIS — M8580 Other specified disorders of bone density and structure, unspecified site: Secondary | ICD-10-CM | POA: Diagnosis not present

## 2015-11-21 DIAGNOSIS — Z760 Encounter for issue of repeat prescription: Secondary | ICD-10-CM | POA: Diagnosis not present

## 2015-11-21 DIAGNOSIS — K5730 Diverticulosis of large intestine without perforation or abscess without bleeding: Secondary | ICD-10-CM | POA: Diagnosis not present

## 2015-11-21 DIAGNOSIS — M3501 Sicca syndrome with keratoconjunctivitis: Secondary | ICD-10-CM | POA: Diagnosis not present

## 2015-11-21 DIAGNOSIS — Z79899 Other long term (current) drug therapy: Secondary | ICD-10-CM | POA: Diagnosis not present

## 2015-11-21 DIAGNOSIS — Z13820 Encounter for screening for osteoporosis: Secondary | ICD-10-CM | POA: Diagnosis not present

## 2015-11-21 DIAGNOSIS — E782 Mixed hyperlipidemia: Secondary | ICD-10-CM | POA: Diagnosis not present

## 2015-11-21 DIAGNOSIS — Z0000 Encounter for general adult medical examination without abnormal findings: Secondary | ICD-10-CM | POA: Diagnosis not present

## 2015-11-21 DIAGNOSIS — M859 Disorder of bone density and structure, unspecified: Secondary | ICD-10-CM | POA: Diagnosis not present

## 2016-08-15 ENCOUNTER — Other Ambulatory Visit: Payer: Self-pay

## 2016-09-13 DIAGNOSIS — L819 Disorder of pigmentation, unspecified: Secondary | ICD-10-CM | POA: Diagnosis not present

## 2016-09-13 DIAGNOSIS — D2271 Melanocytic nevi of right lower limb, including hip: Secondary | ICD-10-CM | POA: Diagnosis not present

## 2016-09-13 DIAGNOSIS — L821 Other seborrheic keratosis: Secondary | ICD-10-CM | POA: Diagnosis not present

## 2016-09-13 DIAGNOSIS — D2272 Melanocytic nevi of left lower limb, including hip: Secondary | ICD-10-CM | POA: Diagnosis not present

## 2016-09-13 DIAGNOSIS — D225 Melanocytic nevi of trunk: Secondary | ICD-10-CM | POA: Diagnosis not present

## 2016-09-13 DIAGNOSIS — L814 Other melanin hyperpigmentation: Secondary | ICD-10-CM | POA: Diagnosis not present

## 2016-09-13 DIAGNOSIS — D2372 Other benign neoplasm of skin of left lower limb, including hip: Secondary | ICD-10-CM | POA: Diagnosis not present

## 2016-10-08 DIAGNOSIS — J0100 Acute maxillary sinusitis, unspecified: Secondary | ICD-10-CM | POA: Diagnosis not present

## 2016-10-23 DIAGNOSIS — Z23 Encounter for immunization: Secondary | ICD-10-CM | POA: Diagnosis not present

## 2016-11-15 DIAGNOSIS — H02201 Unspecified lagophthalmos right upper eyelid: Secondary | ICD-10-CM | POA: Diagnosis not present

## 2016-11-15 DIAGNOSIS — H04123 Dry eye syndrome of bilateral lacrimal glands: Secondary | ICD-10-CM | POA: Diagnosis not present

## 2016-11-15 DIAGNOSIS — H43393 Other vitreous opacities, bilateral: Secondary | ICD-10-CM | POA: Diagnosis not present

## 2016-11-15 DIAGNOSIS — H524 Presbyopia: Secondary | ICD-10-CM | POA: Diagnosis not present

## 2016-11-15 DIAGNOSIS — H353131 Nonexudative age-related macular degeneration, bilateral, early dry stage: Secondary | ICD-10-CM | POA: Diagnosis not present

## 2017-01-17 DIAGNOSIS — N951 Menopausal and female climacteric states: Secondary | ICD-10-CM | POA: Diagnosis not present

## 2017-01-17 DIAGNOSIS — Z79899 Other long term (current) drug therapy: Secondary | ICD-10-CM | POA: Diagnosis not present

## 2017-01-17 DIAGNOSIS — Z0000 Encounter for general adult medical examination without abnormal findings: Secondary | ICD-10-CM | POA: Diagnosis not present

## 2017-01-17 DIAGNOSIS — M3501 Sicca syndrome with keratoconjunctivitis: Secondary | ICD-10-CM | POA: Diagnosis not present

## 2017-01-17 DIAGNOSIS — E559 Vitamin D deficiency, unspecified: Secondary | ICD-10-CM | POA: Diagnosis not present

## 2017-01-17 DIAGNOSIS — E782 Mixed hyperlipidemia: Secondary | ICD-10-CM | POA: Diagnosis not present

## 2017-01-17 DIAGNOSIS — M8580 Other specified disorders of bone density and structure, unspecified site: Secondary | ICD-10-CM | POA: Diagnosis not present

## 2017-03-20 DIAGNOSIS — J301 Allergic rhinitis due to pollen: Secondary | ICD-10-CM | POA: Diagnosis not present

## 2017-03-20 DIAGNOSIS — Z1389 Encounter for screening for other disorder: Secondary | ICD-10-CM | POA: Diagnosis not present

## 2017-03-20 DIAGNOSIS — J0100 Acute maxillary sinusitis, unspecified: Secondary | ICD-10-CM | POA: Diagnosis not present

## 2017-04-02 DIAGNOSIS — Z1231 Encounter for screening mammogram for malignant neoplasm of breast: Secondary | ICD-10-CM | POA: Diagnosis not present

## 2017-05-28 DIAGNOSIS — H10013 Acute follicular conjunctivitis, bilateral: Secondary | ICD-10-CM | POA: Diagnosis not present

## 2017-05-30 DIAGNOSIS — H10013 Acute follicular conjunctivitis, bilateral: Secondary | ICD-10-CM | POA: Diagnosis not present

## 2017-06-05 DIAGNOSIS — H10013 Acute follicular conjunctivitis, bilateral: Secondary | ICD-10-CM | POA: Diagnosis not present

## 2017-08-01 DIAGNOSIS — Z6825 Body mass index (BMI) 25.0-25.9, adult: Secondary | ICD-10-CM | POA: Diagnosis not present

## 2017-08-01 DIAGNOSIS — J0100 Acute maxillary sinusitis, unspecified: Secondary | ICD-10-CM | POA: Diagnosis not present

## 2017-08-15 DIAGNOSIS — H04123 Dry eye syndrome of bilateral lacrimal glands: Secondary | ICD-10-CM | POA: Diagnosis not present

## 2017-08-15 DIAGNOSIS — H10013 Acute follicular conjunctivitis, bilateral: Secondary | ICD-10-CM | POA: Diagnosis not present

## 2017-08-15 DIAGNOSIS — H353131 Nonexudative age-related macular degeneration, bilateral, early dry stage: Secondary | ICD-10-CM | POA: Diagnosis not present

## 2017-08-15 DIAGNOSIS — H43393 Other vitreous opacities, bilateral: Secondary | ICD-10-CM | POA: Diagnosis not present

## 2017-10-09 DIAGNOSIS — Z23 Encounter for immunization: Secondary | ICD-10-CM | POA: Diagnosis not present

## 2017-10-15 DIAGNOSIS — M7062 Trochanteric bursitis, left hip: Secondary | ICD-10-CM | POA: Diagnosis not present

## 2017-10-22 DIAGNOSIS — R2689 Other abnormalities of gait and mobility: Secondary | ICD-10-CM | POA: Diagnosis not present

## 2017-10-22 DIAGNOSIS — M25552 Pain in left hip: Secondary | ICD-10-CM | POA: Diagnosis not present

## 2017-10-22 DIAGNOSIS — M7062 Trochanteric bursitis, left hip: Secondary | ICD-10-CM | POA: Diagnosis not present

## 2017-10-22 DIAGNOSIS — M6281 Muscle weakness (generalized): Secondary | ICD-10-CM | POA: Diagnosis not present

## 2017-10-25 DIAGNOSIS — M7062 Trochanteric bursitis, left hip: Secondary | ICD-10-CM | POA: Diagnosis not present

## 2017-10-25 DIAGNOSIS — R2689 Other abnormalities of gait and mobility: Secondary | ICD-10-CM | POA: Diagnosis not present

## 2017-10-25 DIAGNOSIS — M25552 Pain in left hip: Secondary | ICD-10-CM | POA: Diagnosis not present

## 2017-10-25 DIAGNOSIS — M6281 Muscle weakness (generalized): Secondary | ICD-10-CM | POA: Diagnosis not present

## 2017-10-28 DIAGNOSIS — M6281 Muscle weakness (generalized): Secondary | ICD-10-CM | POA: Diagnosis not present

## 2017-10-28 DIAGNOSIS — M25552 Pain in left hip: Secondary | ICD-10-CM | POA: Diagnosis not present

## 2017-10-28 DIAGNOSIS — M7062 Trochanteric bursitis, left hip: Secondary | ICD-10-CM | POA: Diagnosis not present

## 2017-10-28 DIAGNOSIS — R2689 Other abnormalities of gait and mobility: Secondary | ICD-10-CM | POA: Diagnosis not present

## 2017-11-05 DIAGNOSIS — M25552 Pain in left hip: Secondary | ICD-10-CM | POA: Diagnosis not present

## 2017-11-05 DIAGNOSIS — R2689 Other abnormalities of gait and mobility: Secondary | ICD-10-CM | POA: Diagnosis not present

## 2017-11-05 DIAGNOSIS — M6281 Muscle weakness (generalized): Secondary | ICD-10-CM | POA: Diagnosis not present

## 2017-11-05 DIAGNOSIS — M7062 Trochanteric bursitis, left hip: Secondary | ICD-10-CM | POA: Diagnosis not present

## 2017-11-07 DIAGNOSIS — M25552 Pain in left hip: Secondary | ICD-10-CM | POA: Diagnosis not present

## 2017-11-07 DIAGNOSIS — M7062 Trochanteric bursitis, left hip: Secondary | ICD-10-CM | POA: Diagnosis not present

## 2017-11-07 DIAGNOSIS — R2689 Other abnormalities of gait and mobility: Secondary | ICD-10-CM | POA: Diagnosis not present

## 2017-11-07 DIAGNOSIS — M6281 Muscle weakness (generalized): Secondary | ICD-10-CM | POA: Diagnosis not present

## 2017-11-12 DIAGNOSIS — M6281 Muscle weakness (generalized): Secondary | ICD-10-CM | POA: Diagnosis not present

## 2017-11-12 DIAGNOSIS — R2689 Other abnormalities of gait and mobility: Secondary | ICD-10-CM | POA: Diagnosis not present

## 2017-11-12 DIAGNOSIS — M25552 Pain in left hip: Secondary | ICD-10-CM | POA: Diagnosis not present

## 2017-11-12 DIAGNOSIS — M7062 Trochanteric bursitis, left hip: Secondary | ICD-10-CM | POA: Diagnosis not present

## 2017-11-14 DIAGNOSIS — D2372 Other benign neoplasm of skin of left lower limb, including hip: Secondary | ICD-10-CM | POA: Diagnosis not present

## 2017-11-14 DIAGNOSIS — D2262 Melanocytic nevi of left upper limb, including shoulder: Secondary | ICD-10-CM | POA: Diagnosis not present

## 2017-11-14 DIAGNOSIS — D692 Other nonthrombocytopenic purpura: Secondary | ICD-10-CM | POA: Diagnosis not present

## 2017-11-14 DIAGNOSIS — L812 Freckles: Secondary | ICD-10-CM | POA: Diagnosis not present

## 2017-11-14 DIAGNOSIS — D225 Melanocytic nevi of trunk: Secondary | ICD-10-CM | POA: Diagnosis not present

## 2017-11-14 DIAGNOSIS — L814 Other melanin hyperpigmentation: Secondary | ICD-10-CM | POA: Diagnosis not present

## 2017-11-14 DIAGNOSIS — L821 Other seborrheic keratosis: Secondary | ICD-10-CM | POA: Diagnosis not present

## 2017-11-14 DIAGNOSIS — D2261 Melanocytic nevi of right upper limb, including shoulder: Secondary | ICD-10-CM | POA: Diagnosis not present

## 2017-11-14 DIAGNOSIS — D2272 Melanocytic nevi of left lower limb, including hip: Secondary | ICD-10-CM | POA: Diagnosis not present

## 2017-11-14 DIAGNOSIS — L309 Dermatitis, unspecified: Secondary | ICD-10-CM | POA: Diagnosis not present

## 2017-11-14 DIAGNOSIS — D2271 Melanocytic nevi of right lower limb, including hip: Secondary | ICD-10-CM | POA: Diagnosis not present

## 2017-11-14 DIAGNOSIS — D1801 Hemangioma of skin and subcutaneous tissue: Secondary | ICD-10-CM | POA: Diagnosis not present

## 2017-11-15 DIAGNOSIS — Z6826 Body mass index (BMI) 26.0-26.9, adult: Secondary | ICD-10-CM | POA: Diagnosis not present

## 2017-11-15 DIAGNOSIS — J0100 Acute maxillary sinusitis, unspecified: Secondary | ICD-10-CM | POA: Diagnosis not present

## 2017-11-26 DIAGNOSIS — M7062 Trochanteric bursitis, left hip: Secondary | ICD-10-CM | POA: Diagnosis not present

## 2017-11-29 DIAGNOSIS — R2689 Other abnormalities of gait and mobility: Secondary | ICD-10-CM | POA: Diagnosis not present

## 2017-11-29 DIAGNOSIS — M25552 Pain in left hip: Secondary | ICD-10-CM | POA: Diagnosis not present

## 2017-11-29 DIAGNOSIS — M7062 Trochanteric bursitis, left hip: Secondary | ICD-10-CM | POA: Diagnosis not present

## 2017-11-29 DIAGNOSIS — M6281 Muscle weakness (generalized): Secondary | ICD-10-CM | POA: Diagnosis not present

## 2017-12-06 DIAGNOSIS — M25552 Pain in left hip: Secondary | ICD-10-CM | POA: Diagnosis not present

## 2017-12-06 DIAGNOSIS — M7062 Trochanteric bursitis, left hip: Secondary | ICD-10-CM | POA: Diagnosis not present

## 2017-12-06 DIAGNOSIS — M6281 Muscle weakness (generalized): Secondary | ICD-10-CM | POA: Diagnosis not present

## 2017-12-06 DIAGNOSIS — R2689 Other abnormalities of gait and mobility: Secondary | ICD-10-CM | POA: Diagnosis not present

## 2017-12-12 DIAGNOSIS — R2689 Other abnormalities of gait and mobility: Secondary | ICD-10-CM | POA: Diagnosis not present

## 2017-12-12 DIAGNOSIS — M6281 Muscle weakness (generalized): Secondary | ICD-10-CM | POA: Diagnosis not present

## 2017-12-12 DIAGNOSIS — M25552 Pain in left hip: Secondary | ICD-10-CM | POA: Diagnosis not present

## 2017-12-12 DIAGNOSIS — M7062 Trochanteric bursitis, left hip: Secondary | ICD-10-CM | POA: Diagnosis not present

## 2017-12-19 DIAGNOSIS — M25552 Pain in left hip: Secondary | ICD-10-CM | POA: Diagnosis not present

## 2017-12-19 DIAGNOSIS — M6281 Muscle weakness (generalized): Secondary | ICD-10-CM | POA: Diagnosis not present

## 2017-12-19 DIAGNOSIS — M7062 Trochanteric bursitis, left hip: Secondary | ICD-10-CM | POA: Diagnosis not present

## 2017-12-19 DIAGNOSIS — R2689 Other abnormalities of gait and mobility: Secondary | ICD-10-CM | POA: Diagnosis not present

## 2018-02-13 DIAGNOSIS — H43393 Other vitreous opacities, bilateral: Secondary | ICD-10-CM | POA: Diagnosis not present

## 2018-02-13 DIAGNOSIS — H04123 Dry eye syndrome of bilateral lacrimal glands: Secondary | ICD-10-CM | POA: Diagnosis not present

## 2018-02-13 DIAGNOSIS — H353131 Nonexudative age-related macular degeneration, bilateral, early dry stage: Secondary | ICD-10-CM | POA: Diagnosis not present

## 2018-02-13 DIAGNOSIS — H524 Presbyopia: Secondary | ICD-10-CM | POA: Diagnosis not present

## 2018-03-13 DIAGNOSIS — M8580 Other specified disorders of bone density and structure, unspecified site: Secondary | ICD-10-CM | POA: Diagnosis not present

## 2018-03-13 DIAGNOSIS — Z79899 Other long term (current) drug therapy: Secondary | ICD-10-CM | POA: Diagnosis not present

## 2018-03-13 DIAGNOSIS — Z0000 Encounter for general adult medical examination without abnormal findings: Secondary | ICD-10-CM | POA: Diagnosis not present

## 2018-03-13 DIAGNOSIS — E782 Mixed hyperlipidemia: Secondary | ICD-10-CM | POA: Diagnosis not present

## 2018-03-13 DIAGNOSIS — Z1331 Encounter for screening for depression: Secondary | ICD-10-CM | POA: Diagnosis not present

## 2018-03-13 DIAGNOSIS — Z9181 History of falling: Secondary | ICD-10-CM | POA: Diagnosis not present

## 2018-04-08 DIAGNOSIS — J0101 Acute recurrent maxillary sinusitis: Secondary | ICD-10-CM | POA: Diagnosis not present

## 2018-04-08 DIAGNOSIS — J069 Acute upper respiratory infection, unspecified: Secondary | ICD-10-CM | POA: Diagnosis not present

## 2018-04-21 DIAGNOSIS — L255 Unspecified contact dermatitis due to plants, except food: Secondary | ICD-10-CM | POA: Diagnosis not present

## 2018-04-21 DIAGNOSIS — J329 Chronic sinusitis, unspecified: Secondary | ICD-10-CM | POA: Diagnosis not present

## 2018-04-21 DIAGNOSIS — J0101 Acute recurrent maxillary sinusitis: Secondary | ICD-10-CM | POA: Diagnosis not present

## 2018-04-21 DIAGNOSIS — Z6825 Body mass index (BMI) 25.0-25.9, adult: Secondary | ICD-10-CM | POA: Diagnosis not present

## 2018-04-24 DIAGNOSIS — L814 Other melanin hyperpigmentation: Secondary | ICD-10-CM | POA: Diagnosis not present

## 2018-04-24 DIAGNOSIS — L237 Allergic contact dermatitis due to plants, except food: Secondary | ICD-10-CM | POA: Diagnosis not present

## 2018-05-28 DIAGNOSIS — M810 Age-related osteoporosis without current pathological fracture: Secondary | ICD-10-CM | POA: Diagnosis not present

## 2018-07-30 DIAGNOSIS — Z6826 Body mass index (BMI) 26.0-26.9, adult: Secondary | ICD-10-CM | POA: Diagnosis not present

## 2018-07-30 DIAGNOSIS — J329 Chronic sinusitis, unspecified: Secondary | ICD-10-CM | POA: Diagnosis not present

## 2018-07-30 DIAGNOSIS — J40 Bronchitis, not specified as acute or chronic: Secondary | ICD-10-CM | POA: Diagnosis not present

## 2018-07-30 DIAGNOSIS — R1031 Right lower quadrant pain: Secondary | ICD-10-CM | POA: Diagnosis not present

## 2018-08-14 DIAGNOSIS — H353132 Nonexudative age-related macular degeneration, bilateral, intermediate dry stage: Secondary | ICD-10-CM | POA: Diagnosis not present

## 2018-09-05 DIAGNOSIS — Z1231 Encounter for screening mammogram for malignant neoplasm of breast: Secondary | ICD-10-CM | POA: Diagnosis not present

## 2018-09-13 DIAGNOSIS — H353132 Nonexudative age-related macular degeneration, bilateral, intermediate dry stage: Secondary | ICD-10-CM | POA: Diagnosis not present

## 2018-09-25 DIAGNOSIS — H16223 Keratoconjunctivitis sicca, not specified as Sjogren's, bilateral: Secondary | ICD-10-CM | POA: Diagnosis not present

## 2018-09-25 DIAGNOSIS — H43393 Other vitreous opacities, bilateral: Secondary | ICD-10-CM | POA: Diagnosis not present

## 2018-09-25 DIAGNOSIS — H04123 Dry eye syndrome of bilateral lacrimal glands: Secondary | ICD-10-CM | POA: Diagnosis not present

## 2018-09-25 DIAGNOSIS — H353131 Nonexudative age-related macular degeneration, bilateral, early dry stage: Secondary | ICD-10-CM | POA: Diagnosis not present

## 2018-10-13 DIAGNOSIS — H353132 Nonexudative age-related macular degeneration, bilateral, intermediate dry stage: Secondary | ICD-10-CM | POA: Diagnosis not present

## 2018-10-27 DIAGNOSIS — Z23 Encounter for immunization: Secondary | ICD-10-CM | POA: Diagnosis not present

## 2018-11-12 DIAGNOSIS — H353132 Nonexudative age-related macular degeneration, bilateral, intermediate dry stage: Secondary | ICD-10-CM | POA: Diagnosis not present

## 2018-12-05 DIAGNOSIS — J019 Acute sinusitis, unspecified: Secondary | ICD-10-CM | POA: Diagnosis not present

## 2018-12-05 DIAGNOSIS — B9689 Other specified bacterial agents as the cause of diseases classified elsewhere: Secondary | ICD-10-CM | POA: Diagnosis not present

## 2018-12-05 DIAGNOSIS — Z6826 Body mass index (BMI) 26.0-26.9, adult: Secondary | ICD-10-CM | POA: Diagnosis not present

## 2018-12-05 DIAGNOSIS — R6889 Other general symptoms and signs: Secondary | ICD-10-CM | POA: Diagnosis not present

## 2018-12-12 DIAGNOSIS — H353132 Nonexudative age-related macular degeneration, bilateral, intermediate dry stage: Secondary | ICD-10-CM | POA: Diagnosis not present

## 2018-12-18 DIAGNOSIS — M810 Age-related osteoporosis without current pathological fracture: Secondary | ICD-10-CM | POA: Diagnosis not present

## 2018-12-18 DIAGNOSIS — M546 Pain in thoracic spine: Secondary | ICD-10-CM | POA: Diagnosis not present

## 2018-12-18 DIAGNOSIS — Z6826 Body mass index (BMI) 26.0-26.9, adult: Secondary | ICD-10-CM | POA: Diagnosis not present

## 2018-12-29 DIAGNOSIS — M5033 Other cervical disc degeneration, cervicothoracic region: Secondary | ICD-10-CM | POA: Diagnosis not present

## 2018-12-29 DIAGNOSIS — M47894 Other spondylosis, thoracic region: Secondary | ICD-10-CM | POA: Diagnosis not present

## 2018-12-29 DIAGNOSIS — M549 Dorsalgia, unspecified: Secondary | ICD-10-CM | POA: Diagnosis not present

## 2018-12-29 DIAGNOSIS — M542 Cervicalgia: Secondary | ICD-10-CM | POA: Diagnosis not present

## 2018-12-29 DIAGNOSIS — M47892 Other spondylosis, cervical region: Secondary | ICD-10-CM | POA: Diagnosis not present

## 2018-12-31 DIAGNOSIS — M810 Age-related osteoporosis without current pathological fracture: Secondary | ICD-10-CM | POA: Diagnosis not present

## 2019-01-07 DIAGNOSIS — L814 Other melanin hyperpigmentation: Secondary | ICD-10-CM | POA: Diagnosis not present

## 2019-01-07 DIAGNOSIS — D2372 Other benign neoplasm of skin of left lower limb, including hip: Secondary | ICD-10-CM | POA: Diagnosis not present

## 2019-01-07 DIAGNOSIS — D2272 Melanocytic nevi of left lower limb, including hip: Secondary | ICD-10-CM | POA: Diagnosis not present

## 2019-01-07 DIAGNOSIS — D2271 Melanocytic nevi of right lower limb, including hip: Secondary | ICD-10-CM | POA: Diagnosis not present

## 2019-01-07 DIAGNOSIS — K130 Diseases of lips: Secondary | ICD-10-CM | POA: Diagnosis not present

## 2019-01-07 DIAGNOSIS — L821 Other seborrheic keratosis: Secondary | ICD-10-CM | POA: Diagnosis not present

## 2019-01-07 DIAGNOSIS — D1801 Hemangioma of skin and subcutaneous tissue: Secondary | ICD-10-CM | POA: Diagnosis not present

## 2019-01-11 DIAGNOSIS — H353132 Nonexudative age-related macular degeneration, bilateral, intermediate dry stage: Secondary | ICD-10-CM | POA: Diagnosis not present

## 2019-01-19 DIAGNOSIS — N951 Menopausal and female climacteric states: Secondary | ICD-10-CM | POA: Diagnosis not present

## 2019-01-19 DIAGNOSIS — Z9181 History of falling: Secondary | ICD-10-CM | POA: Diagnosis not present

## 2019-01-19 DIAGNOSIS — M546 Pain in thoracic spine: Secondary | ICD-10-CM | POA: Diagnosis not present

## 2019-01-19 DIAGNOSIS — Z1331 Encounter for screening for depression: Secondary | ICD-10-CM | POA: Diagnosis not present

## 2019-02-03 DIAGNOSIS — M546 Pain in thoracic spine: Secondary | ICD-10-CM | POA: Diagnosis not present

## 2019-02-03 DIAGNOSIS — M47894 Other spondylosis, thoracic region: Secondary | ICD-10-CM | POA: Diagnosis not present

## 2019-02-03 DIAGNOSIS — M79601 Pain in right arm: Secondary | ICD-10-CM | POA: Diagnosis not present

## 2019-02-05 DIAGNOSIS — M79601 Pain in right arm: Secondary | ICD-10-CM | POA: Diagnosis not present

## 2019-02-05 DIAGNOSIS — M47894 Other spondylosis, thoracic region: Secondary | ICD-10-CM | POA: Diagnosis not present

## 2019-02-05 DIAGNOSIS — M546 Pain in thoracic spine: Secondary | ICD-10-CM | POA: Diagnosis not present

## 2019-02-10 DIAGNOSIS — H353132 Nonexudative age-related macular degeneration, bilateral, intermediate dry stage: Secondary | ICD-10-CM | POA: Diagnosis not present

## 2019-03-12 DIAGNOSIS — H353132 Nonexudative age-related macular degeneration, bilateral, intermediate dry stage: Secondary | ICD-10-CM | POA: Diagnosis not present

## 2019-04-10 DIAGNOSIS — H353132 Nonexudative age-related macular degeneration, bilateral, intermediate dry stage: Secondary | ICD-10-CM | POA: Diagnosis not present

## 2019-06-09 DIAGNOSIS — H353132 Nonexudative age-related macular degeneration, bilateral, intermediate dry stage: Secondary | ICD-10-CM | POA: Diagnosis not present

## 2019-07-09 DIAGNOSIS — J301 Allergic rhinitis due to pollen: Secondary | ICD-10-CM | POA: Diagnosis not present

## 2019-07-09 DIAGNOSIS — Z79899 Other long term (current) drug therapy: Secondary | ICD-10-CM | POA: Diagnosis not present

## 2019-07-09 DIAGNOSIS — M810 Age-related osteoporosis without current pathological fracture: Secondary | ICD-10-CM | POA: Diagnosis not present

## 2019-07-09 DIAGNOSIS — Z0000 Encounter for general adult medical examination without abnormal findings: Secondary | ICD-10-CM | POA: Diagnosis not present

## 2019-07-09 DIAGNOSIS — Z6826 Body mass index (BMI) 26.0-26.9, adult: Secondary | ICD-10-CM | POA: Diagnosis not present

## 2019-07-09 DIAGNOSIS — H353132 Nonexudative age-related macular degeneration, bilateral, intermediate dry stage: Secondary | ICD-10-CM | POA: Diagnosis not present

## 2019-07-09 DIAGNOSIS — Z13220 Encounter for screening for lipoid disorders: Secondary | ICD-10-CM | POA: Diagnosis not present

## 2019-07-09 DIAGNOSIS — N951 Menopausal and female climacteric states: Secondary | ICD-10-CM | POA: Diagnosis not present

## 2019-07-09 DIAGNOSIS — K5730 Diverticulosis of large intestine without perforation or abscess without bleeding: Secondary | ICD-10-CM | POA: Diagnosis not present

## 2019-08-08 DIAGNOSIS — H353132 Nonexudative age-related macular degeneration, bilateral, intermediate dry stage: Secondary | ICD-10-CM | POA: Diagnosis not present

## 2019-08-10 DIAGNOSIS — K219 Gastro-esophageal reflux disease without esophagitis: Secondary | ICD-10-CM | POA: Diagnosis not present

## 2019-08-10 DIAGNOSIS — J309 Allergic rhinitis, unspecified: Secondary | ICD-10-CM | POA: Diagnosis not present

## 2019-08-10 DIAGNOSIS — Z8619 Personal history of other infectious and parasitic diseases: Secondary | ICD-10-CM | POA: Diagnosis not present

## 2019-08-11 DIAGNOSIS — M810 Age-related osteoporosis without current pathological fracture: Secondary | ICD-10-CM | POA: Diagnosis not present

## 2019-08-11 DIAGNOSIS — E559 Vitamin D deficiency, unspecified: Secondary | ICD-10-CM | POA: Diagnosis not present

## 2019-09-07 DIAGNOSIS — H353132 Nonexudative age-related macular degeneration, bilateral, intermediate dry stage: Secondary | ICD-10-CM | POA: Diagnosis not present

## 2019-09-09 DIAGNOSIS — E559 Vitamin D deficiency, unspecified: Secondary | ICD-10-CM | POA: Diagnosis not present

## 2019-09-09 DIAGNOSIS — E782 Mixed hyperlipidemia: Secondary | ICD-10-CM | POA: Diagnosis not present

## 2019-09-09 DIAGNOSIS — K219 Gastro-esophageal reflux disease without esophagitis: Secondary | ICD-10-CM | POA: Diagnosis not present

## 2019-09-09 DIAGNOSIS — M810 Age-related osteoporosis without current pathological fracture: Secondary | ICD-10-CM | POA: Diagnosis not present

## 2019-09-14 DIAGNOSIS — Z23 Encounter for immunization: Secondary | ICD-10-CM | POA: Diagnosis not present

## 2019-09-24 DIAGNOSIS — Z23 Encounter for immunization: Secondary | ICD-10-CM | POA: Diagnosis not present

## 2019-10-01 DIAGNOSIS — H524 Presbyopia: Secondary | ICD-10-CM | POA: Diagnosis not present

## 2019-10-01 DIAGNOSIS — H43393 Other vitreous opacities, bilateral: Secondary | ICD-10-CM | POA: Diagnosis not present

## 2019-10-01 DIAGNOSIS — H16223 Keratoconjunctivitis sicca, not specified as Sjogren's, bilateral: Secondary | ICD-10-CM | POA: Diagnosis not present

## 2019-10-01 DIAGNOSIS — H353131 Nonexudative age-related macular degeneration, bilateral, early dry stage: Secondary | ICD-10-CM | POA: Diagnosis not present

## 2019-10-01 DIAGNOSIS — H04123 Dry eye syndrome of bilateral lacrimal glands: Secondary | ICD-10-CM | POA: Diagnosis not present

## 2019-10-05 ENCOUNTER — Encounter: Payer: Self-pay | Admitting: Gastroenterology

## 2019-10-07 DIAGNOSIS — H353132 Nonexudative age-related macular degeneration, bilateral, intermediate dry stage: Secondary | ICD-10-CM | POA: Diagnosis not present

## 2019-10-10 DIAGNOSIS — E559 Vitamin D deficiency, unspecified: Secondary | ICD-10-CM | POA: Diagnosis not present

## 2019-10-10 DIAGNOSIS — M810 Age-related osteoporosis without current pathological fracture: Secondary | ICD-10-CM | POA: Diagnosis not present

## 2019-10-10 DIAGNOSIS — E782 Mixed hyperlipidemia: Secondary | ICD-10-CM | POA: Diagnosis not present

## 2019-11-06 DIAGNOSIS — H353132 Nonexudative age-related macular degeneration, bilateral, intermediate dry stage: Secondary | ICD-10-CM | POA: Diagnosis not present

## 2019-11-12 DIAGNOSIS — E782 Mixed hyperlipidemia: Secondary | ICD-10-CM | POA: Diagnosis not present

## 2020-12-08 DIAGNOSIS — R0981 Nasal congestion: Secondary | ICD-10-CM | POA: Diagnosis not present

## 2020-12-08 DIAGNOSIS — E782 Mixed hyperlipidemia: Secondary | ICD-10-CM | POA: Diagnosis not present

## 2020-12-21 DIAGNOSIS — L718 Other rosacea: Secondary | ICD-10-CM | POA: Diagnosis not present

## 2020-12-21 DIAGNOSIS — L821 Other seborrheic keratosis: Secondary | ICD-10-CM | POA: Diagnosis not present

## 2020-12-30 DIAGNOSIS — H353132 Nonexudative age-related macular degeneration, bilateral, intermediate dry stage: Secondary | ICD-10-CM | POA: Diagnosis not present

## 2021-03-16 DIAGNOSIS — L814 Other melanin hyperpigmentation: Secondary | ICD-10-CM | POA: Diagnosis not present

## 2021-03-16 DIAGNOSIS — D2372 Other benign neoplasm of skin of left lower limb, including hip: Secondary | ICD-10-CM | POA: Diagnosis not present

## 2021-03-16 DIAGNOSIS — D692 Other nonthrombocytopenic purpura: Secondary | ICD-10-CM | POA: Diagnosis not present

## 2021-03-16 DIAGNOSIS — L718 Other rosacea: Secondary | ICD-10-CM | POA: Diagnosis not present

## 2021-03-16 DIAGNOSIS — L820 Inflamed seborrheic keratosis: Secondary | ICD-10-CM | POA: Diagnosis not present

## 2021-03-16 DIAGNOSIS — L821 Other seborrheic keratosis: Secondary | ICD-10-CM | POA: Diagnosis not present

## 2021-03-16 DIAGNOSIS — B351 Tinea unguium: Secondary | ICD-10-CM | POA: Diagnosis not present

## 2021-03-30 DIAGNOSIS — H353132 Nonexudative age-related macular degeneration, bilateral, intermediate dry stage: Secondary | ICD-10-CM | POA: Diagnosis not present

## 2021-04-08 DIAGNOSIS — M810 Age-related osteoporosis without current pathological fracture: Secondary | ICD-10-CM | POA: Diagnosis not present

## 2021-04-08 DIAGNOSIS — E559 Vitamin D deficiency, unspecified: Secondary | ICD-10-CM | POA: Diagnosis not present

## 2021-04-08 DIAGNOSIS — E7800 Pure hypercholesterolemia, unspecified: Secondary | ICD-10-CM | POA: Diagnosis not present

## 2021-04-29 DIAGNOSIS — H353132 Nonexudative age-related macular degeneration, bilateral, intermediate dry stage: Secondary | ICD-10-CM | POA: Diagnosis not present

## 2021-05-08 DIAGNOSIS — M810 Age-related osteoporosis without current pathological fracture: Secondary | ICD-10-CM | POA: Diagnosis not present

## 2021-05-08 DIAGNOSIS — E7800 Pure hypercholesterolemia, unspecified: Secondary | ICD-10-CM | POA: Diagnosis not present

## 2021-05-08 DIAGNOSIS — E559 Vitamin D deficiency, unspecified: Secondary | ICD-10-CM | POA: Diagnosis not present

## 2021-05-18 DIAGNOSIS — H04123 Dry eye syndrome of bilateral lacrimal glands: Secondary | ICD-10-CM | POA: Diagnosis not present

## 2021-05-18 DIAGNOSIS — H16223 Keratoconjunctivitis sicca, not specified as Sjogren's, bilateral: Secondary | ICD-10-CM | POA: Diagnosis not present

## 2021-05-18 DIAGNOSIS — H353131 Nonexudative age-related macular degeneration, bilateral, early dry stage: Secondary | ICD-10-CM | POA: Diagnosis not present

## 2021-05-18 DIAGNOSIS — H524 Presbyopia: Secondary | ICD-10-CM | POA: Diagnosis not present

## 2021-05-18 DIAGNOSIS — H43393 Other vitreous opacities, bilateral: Secondary | ICD-10-CM | POA: Diagnosis not present

## 2021-05-23 DIAGNOSIS — Z1231 Encounter for screening mammogram for malignant neoplasm of breast: Secondary | ICD-10-CM | POA: Diagnosis not present

## 2021-05-29 DIAGNOSIS — Z23 Encounter for immunization: Secondary | ICD-10-CM | POA: Diagnosis not present

## 2021-05-29 DIAGNOSIS — H353132 Nonexudative age-related macular degeneration, bilateral, intermediate dry stage: Secondary | ICD-10-CM | POA: Diagnosis not present

## 2021-06-08 DIAGNOSIS — M810 Age-related osteoporosis without current pathological fracture: Secondary | ICD-10-CM | POA: Diagnosis not present

## 2021-06-08 DIAGNOSIS — E559 Vitamin D deficiency, unspecified: Secondary | ICD-10-CM | POA: Diagnosis not present

## 2021-06-08 DIAGNOSIS — E7800 Pure hypercholesterolemia, unspecified: Secondary | ICD-10-CM | POA: Diagnosis not present

## 2021-06-28 DIAGNOSIS — H353132 Nonexudative age-related macular degeneration, bilateral, intermediate dry stage: Secondary | ICD-10-CM | POA: Diagnosis not present

## 2021-07-07 DIAGNOSIS — Z6826 Body mass index (BMI) 26.0-26.9, adult: Secondary | ICD-10-CM | POA: Diagnosis not present

## 2021-07-07 DIAGNOSIS — T63443A Toxic effect of venom of bees, assault, initial encounter: Secondary | ICD-10-CM | POA: Diagnosis not present

## 2021-07-09 DIAGNOSIS — E7800 Pure hypercholesterolemia, unspecified: Secondary | ICD-10-CM | POA: Diagnosis not present

## 2021-07-09 DIAGNOSIS — E559 Vitamin D deficiency, unspecified: Secondary | ICD-10-CM | POA: Diagnosis not present

## 2021-07-09 DIAGNOSIS — M810 Age-related osteoporosis without current pathological fracture: Secondary | ICD-10-CM | POA: Diagnosis not present

## 2021-07-10 DIAGNOSIS — T63443D Toxic effect of venom of bees, assault, subsequent encounter: Secondary | ICD-10-CM | POA: Diagnosis not present

## 2021-07-10 DIAGNOSIS — Z6822 Body mass index (BMI) 22.0-22.9, adult: Secondary | ICD-10-CM | POA: Diagnosis not present

## 2021-07-17 DIAGNOSIS — Z1331 Encounter for screening for depression: Secondary | ICD-10-CM | POA: Diagnosis not present

## 2021-07-17 DIAGNOSIS — T63443D Toxic effect of venom of bees, assault, subsequent encounter: Secondary | ICD-10-CM | POA: Diagnosis not present

## 2021-07-17 DIAGNOSIS — Z6826 Body mass index (BMI) 26.0-26.9, adult: Secondary | ICD-10-CM | POA: Diagnosis not present

## 2021-07-17 DIAGNOSIS — Z9181 History of falling: Secondary | ICD-10-CM | POA: Diagnosis not present

## 2021-07-25 DIAGNOSIS — M810 Age-related osteoporosis without current pathological fracture: Secondary | ICD-10-CM | POA: Diagnosis not present

## 2021-07-28 DIAGNOSIS — H353132 Nonexudative age-related macular degeneration, bilateral, intermediate dry stage: Secondary | ICD-10-CM | POA: Diagnosis not present

## 2021-08-09 DIAGNOSIS — E7800 Pure hypercholesterolemia, unspecified: Secondary | ICD-10-CM | POA: Diagnosis not present

## 2021-08-09 DIAGNOSIS — M810 Age-related osteoporosis without current pathological fracture: Secondary | ICD-10-CM | POA: Diagnosis not present

## 2021-08-09 DIAGNOSIS — E559 Vitamin D deficiency, unspecified: Secondary | ICD-10-CM | POA: Diagnosis not present

## 2021-08-27 DIAGNOSIS — H353132 Nonexudative age-related macular degeneration, bilateral, intermediate dry stage: Secondary | ICD-10-CM | POA: Diagnosis not present

## 2021-08-28 DIAGNOSIS — Z79899 Other long term (current) drug therapy: Secondary | ICD-10-CM | POA: Diagnosis not present

## 2021-08-28 DIAGNOSIS — H353290 Exudative age-related macular degeneration, unspecified eye, stage unspecified: Secondary | ICD-10-CM | POA: Diagnosis not present

## 2021-08-28 DIAGNOSIS — Z0000 Encounter for general adult medical examination without abnormal findings: Secondary | ICD-10-CM | POA: Diagnosis not present

## 2021-08-28 DIAGNOSIS — M3501 Sicca syndrome with keratoconjunctivitis: Secondary | ICD-10-CM | POA: Diagnosis not present

## 2021-08-28 DIAGNOSIS — M25551 Pain in right hip: Secondary | ICD-10-CM | POA: Diagnosis not present

## 2021-08-28 DIAGNOSIS — K529 Noninfective gastroenteritis and colitis, unspecified: Secondary | ICD-10-CM | POA: Diagnosis not present

## 2021-09-26 DIAGNOSIS — H353132 Nonexudative age-related macular degeneration, bilateral, intermediate dry stage: Secondary | ICD-10-CM | POA: Diagnosis not present

## 2021-09-27 DIAGNOSIS — E538 Deficiency of other specified B group vitamins: Secondary | ICD-10-CM | POA: Diagnosis not present

## 2021-09-27 DIAGNOSIS — Z23 Encounter for immunization: Secondary | ICD-10-CM | POA: Diagnosis not present

## 2021-09-27 DIAGNOSIS — Z6826 Body mass index (BMI) 26.0-26.9, adult: Secondary | ICD-10-CM | POA: Diagnosis not present

## 2021-09-27 DIAGNOSIS — M25551 Pain in right hip: Secondary | ICD-10-CM | POA: Diagnosis not present

## 2021-10-16 DIAGNOSIS — Z23 Encounter for immunization: Secondary | ICD-10-CM | POA: Diagnosis not present

## 2021-10-26 DIAGNOSIS — H353132 Nonexudative age-related macular degeneration, bilateral, intermediate dry stage: Secondary | ICD-10-CM | POA: Diagnosis not present

## 2021-11-15 DIAGNOSIS — M4316 Spondylolisthesis, lumbar region: Secondary | ICD-10-CM | POA: Diagnosis not present

## 2021-11-15 DIAGNOSIS — M7061 Trochanteric bursitis, right hip: Secondary | ICD-10-CM | POA: Diagnosis not present

## 2021-11-25 DIAGNOSIS — H353132 Nonexudative age-related macular degeneration, bilateral, intermediate dry stage: Secondary | ICD-10-CM | POA: Diagnosis not present

## 2021-11-28 DIAGNOSIS — J302 Other seasonal allergic rhinitis: Secondary | ICD-10-CM | POA: Diagnosis not present

## 2021-11-28 DIAGNOSIS — J40 Bronchitis, not specified as acute or chronic: Secondary | ICD-10-CM | POA: Diagnosis not present

## 2021-11-28 DIAGNOSIS — Z20828 Contact with and (suspected) exposure to other viral communicable diseases: Secondary | ICD-10-CM | POA: Diagnosis not present

## 2021-11-28 DIAGNOSIS — J329 Chronic sinusitis, unspecified: Secondary | ICD-10-CM | POA: Diagnosis not present

## 2021-11-30 DIAGNOSIS — H6591 Unspecified nonsuppurative otitis media, right ear: Secondary | ICD-10-CM | POA: Diagnosis not present

## 2021-11-30 DIAGNOSIS — T7840XA Allergy, unspecified, initial encounter: Secondary | ICD-10-CM | POA: Diagnosis not present

## 2021-11-30 DIAGNOSIS — Z6826 Body mass index (BMI) 26.0-26.9, adult: Secondary | ICD-10-CM | POA: Diagnosis not present

## 2021-11-30 DIAGNOSIS — B3731 Acute candidiasis of vulva and vagina: Secondary | ICD-10-CM | POA: Diagnosis not present

## 2021-12-14 DIAGNOSIS — H16223 Keratoconjunctivitis sicca, not specified as Sjogren's, bilateral: Secondary | ICD-10-CM | POA: Diagnosis not present

## 2021-12-14 DIAGNOSIS — H524 Presbyopia: Secondary | ICD-10-CM | POA: Diagnosis not present

## 2021-12-14 DIAGNOSIS — H10823 Rosacea conjunctivitis, bilateral: Secondary | ICD-10-CM | POA: Diagnosis not present

## 2021-12-14 DIAGNOSIS — H04123 Dry eye syndrome of bilateral lacrimal glands: Secondary | ICD-10-CM | POA: Diagnosis not present

## 2021-12-14 DIAGNOSIS — T7840XD Allergy, unspecified, subsequent encounter: Secondary | ICD-10-CM | POA: Diagnosis not present

## 2021-12-14 DIAGNOSIS — H353131 Nonexudative age-related macular degeneration, bilateral, early dry stage: Secondary | ICD-10-CM | POA: Diagnosis not present

## 2021-12-14 DIAGNOSIS — J302 Other seasonal allergic rhinitis: Secondary | ICD-10-CM | POA: Diagnosis not present

## 2021-12-14 DIAGNOSIS — Z6826 Body mass index (BMI) 26.0-26.9, adult: Secondary | ICD-10-CM | POA: Diagnosis not present

## 2021-12-14 DIAGNOSIS — H6591 Unspecified nonsuppurative otitis media, right ear: Secondary | ICD-10-CM | POA: Diagnosis not present

## 2021-12-22 DIAGNOSIS — M25551 Pain in right hip: Secondary | ICD-10-CM | POA: Diagnosis not present

## 2021-12-22 DIAGNOSIS — M7061 Trochanteric bursitis, right hip: Secondary | ICD-10-CM | POA: Diagnosis not present

## 2021-12-25 DIAGNOSIS — H353132 Nonexudative age-related macular degeneration, bilateral, intermediate dry stage: Secondary | ICD-10-CM | POA: Diagnosis not present

## 2022-01-24 DIAGNOSIS — H353132 Nonexudative age-related macular degeneration, bilateral, intermediate dry stage: Secondary | ICD-10-CM | POA: Diagnosis not present

## 2022-01-30 DIAGNOSIS — M810 Age-related osteoporosis without current pathological fracture: Secondary | ICD-10-CM | POA: Diagnosis not present

## 2022-02-23 DIAGNOSIS — H353132 Nonexudative age-related macular degeneration, bilateral, intermediate dry stage: Secondary | ICD-10-CM | POA: Diagnosis not present

## 2022-03-05 DIAGNOSIS — M25551 Pain in right hip: Secondary | ICD-10-CM | POA: Diagnosis not present

## 2022-03-05 DIAGNOSIS — M7061 Trochanteric bursitis, right hip: Secondary | ICD-10-CM | POA: Diagnosis not present

## 2022-03-05 DIAGNOSIS — M48062 Spinal stenosis, lumbar region with neurogenic claudication: Secondary | ICD-10-CM | POA: Diagnosis not present

## 2022-03-05 DIAGNOSIS — M4316 Spondylolisthesis, lumbar region: Secondary | ICD-10-CM | POA: Diagnosis not present

## 2022-03-25 DIAGNOSIS — H353132 Nonexudative age-related macular degeneration, bilateral, intermediate dry stage: Secondary | ICD-10-CM | POA: Diagnosis not present

## 2022-04-03 DIAGNOSIS — B0089 Other herpesviral infection: Secondary | ICD-10-CM | POA: Diagnosis not present

## 2022-04-03 DIAGNOSIS — L821 Other seborrheic keratosis: Secondary | ICD-10-CM | POA: Diagnosis not present

## 2022-04-03 DIAGNOSIS — L608 Other nail disorders: Secondary | ICD-10-CM | POA: Diagnosis not present

## 2022-04-03 DIAGNOSIS — L820 Inflamed seborrheic keratosis: Secondary | ICD-10-CM | POA: Diagnosis not present

## 2022-04-03 DIAGNOSIS — L718 Other rosacea: Secondary | ICD-10-CM | POA: Diagnosis not present

## 2022-04-03 DIAGNOSIS — D2372 Other benign neoplasm of skin of left lower limb, including hip: Secondary | ICD-10-CM | POA: Diagnosis not present

## 2022-04-03 DIAGNOSIS — L603 Nail dystrophy: Secondary | ICD-10-CM | POA: Diagnosis not present

## 2022-04-03 DIAGNOSIS — L814 Other melanin hyperpigmentation: Secondary | ICD-10-CM | POA: Diagnosis not present

## 2022-04-23 DIAGNOSIS — B9689 Other specified bacterial agents as the cause of diseases classified elsewhere: Secondary | ICD-10-CM | POA: Diagnosis not present

## 2022-04-23 DIAGNOSIS — Z6825 Body mass index (BMI) 25.0-25.9, adult: Secondary | ICD-10-CM | POA: Diagnosis not present

## 2022-04-23 DIAGNOSIS — R0981 Nasal congestion: Secondary | ICD-10-CM | POA: Diagnosis not present

## 2022-04-23 DIAGNOSIS — J019 Acute sinusitis, unspecified: Secondary | ICD-10-CM | POA: Diagnosis not present

## 2022-04-24 DIAGNOSIS — H353132 Nonexudative age-related macular degeneration, bilateral, intermediate dry stage: Secondary | ICD-10-CM | POA: Diagnosis not present

## 2022-05-24 DIAGNOSIS — H353132 Nonexudative age-related macular degeneration, bilateral, intermediate dry stage: Secondary | ICD-10-CM | POA: Diagnosis not present

## 2022-05-29 DIAGNOSIS — B3731 Acute candidiasis of vulva and vagina: Secondary | ICD-10-CM | POA: Diagnosis not present

## 2022-05-29 DIAGNOSIS — J0101 Acute recurrent maxillary sinusitis: Secondary | ICD-10-CM | POA: Diagnosis not present

## 2022-05-29 DIAGNOSIS — R0981 Nasal congestion: Secondary | ICD-10-CM | POA: Diagnosis not present

## 2022-05-29 DIAGNOSIS — J302 Other seasonal allergic rhinitis: Secondary | ICD-10-CM | POA: Diagnosis not present

## 2022-06-04 DIAGNOSIS — M25551 Pain in right hip: Secondary | ICD-10-CM | POA: Diagnosis not present

## 2022-06-04 DIAGNOSIS — M7061 Trochanteric bursitis, right hip: Secondary | ICD-10-CM | POA: Diagnosis not present

## 2022-06-23 DIAGNOSIS — H353132 Nonexudative age-related macular degeneration, bilateral, intermediate dry stage: Secondary | ICD-10-CM | POA: Diagnosis not present

## 2022-07-23 DIAGNOSIS — H353132 Nonexudative age-related macular degeneration, bilateral, intermediate dry stage: Secondary | ICD-10-CM | POA: Diagnosis not present

## 2022-08-16 DIAGNOSIS — Z1231 Encounter for screening mammogram for malignant neoplasm of breast: Secondary | ICD-10-CM | POA: Diagnosis not present

## 2022-08-22 DIAGNOSIS — H353132 Nonexudative age-related macular degeneration, bilateral, intermediate dry stage: Secondary | ICD-10-CM | POA: Diagnosis not present

## 2022-08-29 ENCOUNTER — Encounter: Payer: Self-pay | Admitting: Allergy and Immunology

## 2022-08-29 ENCOUNTER — Ambulatory Visit (INDEPENDENT_AMBULATORY_CARE_PROVIDER_SITE_OTHER): Payer: Medicare Other | Admitting: Allergy and Immunology

## 2022-08-29 VITALS — BP 138/70 | HR 73 | Temp 98.1°F | Resp 18 | Ht 60.0 in | Wt 138.0 lb

## 2022-08-29 DIAGNOSIS — J3089 Other allergic rhinitis: Secondary | ICD-10-CM

## 2022-08-29 DIAGNOSIS — K219 Gastro-esophageal reflux disease without esophagitis: Secondary | ICD-10-CM | POA: Diagnosis not present

## 2022-08-29 DIAGNOSIS — T50905D Adverse effect of unspecified drugs, medicaments and biological substances, subsequent encounter: Secondary | ICD-10-CM

## 2022-08-29 MED ORDER — RYALTRIS 665-25 MCG/ACT NA SUSP: 2.0000 | Freq: Two times a day (BID) | NASAL | 5 refills | Status: DC | PRN

## 2022-08-29 NOTE — Progress Notes (Unsigned)
[place] [facility] [place] [facility] [place] - [place] - [place]   Dear Dr. Jeryl Columbia,  Thank you for referring Zhanae Proffit to the [facility] on 08/29/2022.   Below is a summation of this patient's evaluation and recommendations.  Thank you for your referral. I will keep you informed about this patient's response to treatment.   If you have any questions please do not hesitate to contact me.   Sincerely,  Jiles Prows, MD Allergy / Immunology [place]   ______________________________________________________________________    NEW PATIENT NOTE  Referring Provider: Serita Grammes, MD Primary Provider: Serita Grammes, MD Date of office visit: 08/29/2022    Subjective:   Chief Complaint:  Kayla Cain (DOB: 1940/12/31) is a 81 y.o. female who presents to the clinic on 08/29/2022 with a chief complaint of Sinusitis (Runny nose ) .     HPI: Larri presents to this clinic in evaluation of recurrent sinus infections.  Cory developed sinus infections manifested as facial pain with a predilection for the right side more than the left side along with congested nose and sneezing and lots of postnasal drip about 3 times a year for which she is usually treated with an antibiotic and on occasion some steroids.  She usually responds to treatment within a week.  In between these episodes of sinusitis she does have spring and fall sneezing and some itchy eyes.  Provoking factors may be exposure to the outdoors.  She consistently uses montelukast and nasal azelastine.  In addition, she always has postnasal drip and throat clearing and feeling as though there is something in her throat and intermittent raspy voice.  She has heartburn with regurgitation and burning and burping.  She takes Pepcid about 3 times per week.  She consumes chocolate daily.  She has dry eye syndrome and is on Restasis twice a  day and takes some Systane as well.  She uses oral antihistamines.  She had a dermatitis develop with penicillin administration about 50 years ago.  She cannot really remember taking amoxicillin or Augmentin.  She may have developed itchiness when using doxycycline.  She cannot tolerate macrolides secondary to GI upset.  Past Medical History:  Diagnosis Date   Diverticulitis    Diverticulosis    Esophageal stricture    Gastric polyp    GERD (gastroesophageal reflux disease)    Hiatal hernia    Hyperlipidemia    Mitral valve problem    Followed by Dr. Ron Parker briefly.  Did not tolertate B blocker.  Murmur disappeared & now off cardiac meds w/o symptoms    Past Surgical History:  Procedure Laterality Date   COLON SURGERY  08/28/11   lap sigmoid colectomy -diverticulitis   NASAL SEPTUM SURGERY     PARTIAL HYSTERECTOMY     TONSILLECTOMY AND ADENOIDECTOMY      Allergies as of 08/29/2022       Reactions   Penicillins Rash   All over the body   Lubiprostone Nausea And Vomiting   Macrolides And Ketolides    GI Intolerance   Erythromycin Nausea Only        Medication List     AMBULATORY NON FORMULARY MEDICATION Mega Red Krill 500 MG  One by mouth once daily   azelastine 0.1 % nasal spray Commonly known as: ASTELIN Place 2 sprays into both nostrils 2 (two) times daily.   estradiol 0.5 MG tablet Commonly known as: ESTRACE Take  0.5 mg by mouth daily.   loratadine 10 MG tablet Commonly known as: CLARITIN Take 10 mg by mouth daily.   montelukast 10 MG tablet Commonly known as: SINGULAIR Take 10 mg by mouth at bedtime.   Prolia 60 MG/ML Sosy injection Generic drug: denosumab SMARTSIG:1 Milliliter(s) SUB-Q Twice a Year   Restasis 0.05 % ophthalmic emulsion Generic drug: cycloSPORINE   tretinoin 0.05 % cream Commonly known as: RETIN-A SMARTSIG:sparingly Topical Every Night PRN   valACYclovir 1000 MG tablet Commonly known as: VALTREX Take 1,000 mg by mouth  daily.   Vitamin D 1000 units capsule Take 1,000 Units by mouth daily.        Review of systems negative except as noted in HPI / PMHx or noted below:  Review of Systems  Constitutional: Negative.   HENT: Negative.    Eyes: Negative.   Respiratory: Negative.    Cardiovascular: Negative.   Gastrointestinal: Negative.   Genitourinary: Negative.   Musculoskeletal: Negative.   Skin: Negative.   Neurological: Negative.   Endo/Heme/Allergies: Negative.   Psychiatric/Behavioral: Negative.      Family History  Problem Relation Age of Onset   Breast cancer Mother    Cancer Mother        breast   Prostate cancer Brother    Liver disease Brother    Heart disease Brother    Colon cancer Neg Hx     Social History   Socioeconomic History   Marital status: Single    Spouse name: Not on file   Number of children: Not on file   Years of education: Not on file   Highest education level: Not on file  Occupational History   Occupation: [employer] technologist   Tobacco Use   Smoking status: Never   Smokeless tobacco: Never  Substance and Sexual Activity   Alcohol use: Yes    Comment: rare    Drug use: No   Sexual activity: Not on file  Other Topics Concern   Not on file  Social History Narrative   Not on file   Environmental and Social history  Lives in a townhouse with a dry environment, no animals located inside the household, carpet in the bedroom, no plastic on the bed, no plastic on the pillow, no smoking ongoing with inside the household.  Objective:   Vitals:   08/29/22 0948  BP: 138/70  Pulse: 73  Resp: 18  Temp: 98.1 F (36.7 C)  SpO2: 98%   Height: 5' (152.4 cm) Weight: 138 lb (62.6 kg)  Physical Exam Constitutional:      Appearance: She is not diaphoretic.  HENT:     Head: Normocephalic.     Right Ear: Tympanic membrane, ear canal and external ear normal.     Left Ear: Tympanic membrane, ear canal and external ear normal.     Nose: Nose normal. No  mucosal edema or rhinorrhea.     Mouth/Throat:     Pharynx: Uvula midline. No oropharyngeal exudate.  Eyes:     Conjunctiva/sclera: Conjunctivae normal.  Neck:     Thyroid: No thyromegaly.     Trachea: Trachea normal. No tracheal tenderness or tracheal deviation.  Cardiovascular:     Rate and Rhythm: Normal rate and regular rhythm.     Heart sounds: Normal heart sounds, S1 normal and S2 normal. No murmur heard. Pulmonary:     Effort: No respiratory distress.     Breath sounds: Normal breath sounds. No stridor. No wheezing or rales.  Lymphadenopathy:  Head:     Right side of head: No tonsillar adenopathy.     Left side of head: No tonsillar adenopathy.     Cervical: No cervical adenopathy.  Skin:    Findings: No erythema or rash.     Nails: There is no clubbing.  Neurological:     Mental Status: She is alert.     Diagnostics: Allergy skin tests were performed.  She did not demonstrate any hypersensitivity against a screening panel of aeroallergens.  Her histamine control was relatively small.  Assessment and Plan:    1. Perennial allergic rhinitis   2. LPRD (laryngopharyngeal reflux disease)   3. Adverse drug effect, subsequent encounter    1.  Allergen avoidance measures???  2.  Treat and prevent inflammation:   A. Ryaltris - 2 sprays each nostril 1-2 times per day (SP)  3.  Treat and prevent reflux/LPR:   A. Minimize chocolate consumption  B. Nexium 40 mg - 1 tablet in AM  C. Famotidine 40 mg - 1 tablet in PM  4.  If needed:   A. Nasal saline  B. Systane C. Pataday - 1 drop each eye 1 time per day  5.  Remain away from oral antihistamine use secondary to dry eye  6.  Plan for flu vaccine and RSV vaccine  7.  Return to clinic in 4 weeks or earlier if problem  8.  Antibiotic hypersensitivity evaluation in the future  Cortney has a history very consistent with irritation and inflammation of her airway and this is probably a result of recurrent viral  respiratory tract infections and reflux induced respiratory disease.  For the next 4 weeks she will treat inflammation with combination nasal steroid/antihistamine and we will start her on therapy for LPR.  We will work through her antibiotic hypersensitivity directed a penicillin at some point in the future.   Jiles Prows, MD Allergy / Immunology [facility]

## 2022-08-29 NOTE — Patient Instructions (Addendum)
  1.  Allergen avoidance measures???  2.  Treat and prevent inflammation:   A. Ryaltris - 2 sprays each nostril 1-2 times per day (SP)  3.  Treat and prevent reflux/LPR:   A. Minimize chocolate consumption  B. Nexium 40 mg - 1 tablet in AM  C. Famotidine 40 mg - 1 tablet in PM  4.  If needed:   A. Nasal saline  B. Systane C. Pataday - 1 drop each eye 1 time per day  5.  Remain away from oral antihistamine use secondary to dry eye  6.  Plan for flu vaccine and RSV vaccine  7.  Return to clinic in 4 weeks or earlier if problem  8.  Antibiotic hypersensitivity evaluation in the future

## 2022-08-30 ENCOUNTER — Encounter: Payer: Self-pay | Admitting: Allergy and Immunology

## 2022-08-30 ENCOUNTER — Other Ambulatory Visit: Payer: Self-pay

## 2022-08-30 MED ORDER — FAMOTIDINE 40 MG PO TABS: 40.0000 mg | ORAL_TABLET | Freq: Every evening | ORAL | 5 refills | Status: DC

## 2022-08-30 MED ORDER — ESOMEPRAZOLE MAGNESIUM 40 MG PO CPDR: 40.0000 mg | DELAYED_RELEASE_CAPSULE | Freq: Every morning | ORAL | 5 refills | Status: AC

## 2022-08-31 DIAGNOSIS — R7302 Impaired glucose tolerance (oral): Secondary | ICD-10-CM | POA: Diagnosis not present

## 2022-08-31 DIAGNOSIS — Z0000 Encounter for general adult medical examination without abnormal findings: Secondary | ICD-10-CM | POA: Diagnosis not present

## 2022-08-31 DIAGNOSIS — M810 Age-related osteoporosis without current pathological fracture: Secondary | ICD-10-CM | POA: Diagnosis not present

## 2022-08-31 DIAGNOSIS — Z79899 Other long term (current) drug therapy: Secondary | ICD-10-CM | POA: Diagnosis not present

## 2022-09-18 DIAGNOSIS — Z23 Encounter for immunization: Secondary | ICD-10-CM | POA: Diagnosis not present

## 2022-09-21 DIAGNOSIS — H353132 Nonexudative age-related macular degeneration, bilateral, intermediate dry stage: Secondary | ICD-10-CM | POA: Diagnosis not present

## 2022-09-27 ENCOUNTER — Encounter: Payer: Self-pay | Admitting: Allergy and Immunology

## 2022-09-27 ENCOUNTER — Ambulatory Visit (INDEPENDENT_AMBULATORY_CARE_PROVIDER_SITE_OTHER): Payer: Medicare Other | Admitting: Allergy and Immunology

## 2022-09-27 VITALS — BP 126/82 | HR 90 | Resp 16

## 2022-09-27 DIAGNOSIS — J3089 Other allergic rhinitis: Secondary | ICD-10-CM | POA: Diagnosis not present

## 2022-09-27 DIAGNOSIS — K219 Gastro-esophageal reflux disease without esophagitis: Secondary | ICD-10-CM | POA: Diagnosis not present

## 2022-09-27 DIAGNOSIS — T50905D Adverse effect of unspecified drugs, medicaments and biological substances, subsequent encounter: Secondary | ICD-10-CM

## 2022-09-27 NOTE — Patient Instructions (Addendum)
  1.  Continue to treat and prevent inflammation:   A. Ryaltris - 2 sprays each nostril 1-2 times per day (SP)  2.  Continue to treat and prevent reflux/LPR:   A. Minimize chocolate consumption  B. Nexium 40 mg - 1 tablet in AM  C. Famotidine 40 mg - 1 tablet in PM  D. Replace all throat clearing with drinking/swallowing maneuver  3.  If needed:   A. Nasal saline  B. Systane C. Pataday - 1 drop each eye 1 time per day  4.  Remain away from oral antihistamine use secondary to dry eye  5.  Plan for flu vaccine and RSV vaccine  6.  Return to clinic end of December 2023 or earlier if problem  7.  Antibiotic hypersensitivity evaluation in the future

## 2022-09-27 NOTE — Progress Notes (Signed)
[place]   Follow-up Note  Referring Provider: Serita Grammes, MD Primary Provider: Serita Grammes, MD Date of Office Visit: 09/27/2022  Subjective:   Kayla Cain (DOB: 12/17/1940) is a 81 y.o. 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  I last saw her in this clinic during her initial evaluation of 29 August 2022.  She is doing much better with her plan of therapy directed against inflammation of her airway and LPR.  She states that she is over 50% improved regarding all of the issues tied up with her upper airway and her throat.  And her dry eye issue is going much better now that she has eliminated the use of all oral antihistamines.  Allergies as of 09/27/2022       Reactions   Penicillins Rash   All over the body   Lubiprostone Nausea And Vomiting   Macrolides And Ketolides    GI Intolerance   Erythromycin Nausea Only        Medication List    AMBULATORY NON FORMULARY MEDICATION Mega Red Krill 500 MG  One by mouth once daily   azelastine 0.1 % nasal spray Commonly known as: ASTELIN Place 2 sprays into both nostrils 2 (two) times daily.   esomeprazole 40 MG capsule Commonly known as: NexIUM Take 1 capsule (40 mg total) by mouth in the morning.   estradiol 0.5 MG tablet Commonly known as: ESTRACE Take 0.5 mg by mouth daily.   famotidine 40 MG tablet Commonly known as: PEPCID Take 1 tablet (40 mg total) by mouth every evening.   fluticasone 50 MCG/ACT nasal spray Commonly known as: FLONASE Place 1 spray into the nose daily.   loratadine 10 MG tablet Commonly known as: CLARITIN Take 10 mg by mouth daily.   montelukast 10 MG tablet Commonly  known as: SINGULAIR Take 10 mg by mouth at bedtime.   Prolia 60 MG/ML Sosy injection Generic drug: denosumab SMARTSIG:1 Milliliter(s) SUB-Q Twice a Year   Restasis 0.05 % ophthalmic emulsion Generic drug: cycloSPORINE   Ryaltris 665-25 MCG/ACT Susp Generic drug: Olopatadine-Mometasone Place 2 sprays into both nostrils 2 (two) times daily as needed.   tretinoin 0.05 % cream Commonly known as: RETIN-A SMARTSIG:sparingly Topical Every Night PRN   valACYclovir 1000 MG tablet Commonly known as: VALTREX Take 1,000 mg by mouth daily.   Vitamin D 1000 units capsule Take 1,000 Units by mouth daily.    Past Medical History:  Diagnosis Date   Diverticulitis    Diverticulosis    Esophageal stricture    Gastric polyp    GERD (gastroesophageal reflux disease)    Hiatal hernia    Hyperlipidemia    Mitral valve problem    Followed by Dr. Ron Parker briefly.  Did not tolertate B blocker.  Murmur disappeared & now off cardiac meds w/o symptoms    Past Surgical History:  Procedure Laterality Date   COLON SURGERY  08/28/11   lap sigmoid colectomy -diverticulitis   NASAL SEPTUM SURGERY     PARTIAL HYSTERECTOMY     TONSILLECTOMY AND ADENOIDECTOMY      Review of systems negative except as noted in HPI / PMHx or noted below:  Review of Systems  Constitutional: Negative.   HENT: Negative.  Eyes: Negative.   Respiratory: Negative.    Cardiovascular: Negative.   Gastrointestinal: Negative.   Genitourinary: Negative.   Musculoskeletal: Negative.   Skin: Negative.   Neurological: Negative.   Endo/Heme/Allergies: Negative.   Psychiatric/Behavioral: Negative.       Objective:   Vitals:   09/27/22 0905  BP: 126/82  Pulse: 90  Resp: 16  SpO2: 98%          Physical Exam Constitutional:      Appearance: She is not diaphoretic.  HENT:     Head: Normocephalic.     Right Ear: Tympanic membrane, ear canal and external ear normal.     Left Ear: Tympanic membrane, ear canal and  external ear normal.     Nose: Nose normal. No mucosal edema or rhinorrhea.     Mouth/Throat:     Pharynx: Uvula midline. No oropharyngeal exudate.  Eyes:     Conjunctiva/sclera: Conjunctivae normal.  Neck:     Thyroid: No thyromegaly.     Trachea: Trachea normal. No tracheal tenderness or tracheal deviation.  Cardiovascular:     Rate and Rhythm: Normal rate and regular rhythm.     Heart sounds: Normal heart sounds, S1 normal and S2 normal. No murmur heard. Pulmonary:     Effort: No respiratory distress.     Breath sounds: Normal breath sounds. No stridor. No wheezing or rales.  Lymphadenopathy:     Head:     Right side of head: No tonsillar adenopathy.     Left side of head: No tonsillar adenopathy.     Cervical: No cervical adenopathy.  Skin:    Findings: No erythema or rash.     Nails: There is no clubbing.  Neurological:     Mental Status: She is alert.     Diagnostics: none  Assessment and Plan:   1. Perennial allergic rhinitis   2. LPRD (laryngopharyngeal reflux disease)   3. Adverse drug effect, subsequent encounter    1.  Continue to treat and prevent inflammation:   A. Ryaltris - 2 sprays each nostril 1-2 times per day (SP)  2.  Continue to treat and prevent reflux/LPR:   A. Minimize chocolate consumption  B. Nexium 40 mg - 1 tablet in AM  C. Famotidine 40 mg - 1 tablet in PM  D. Replace all throat clearing with drinking/swallowing maneuver  3.  If needed:   A. Nasal saline  B. Systane C. Pataday - 1 drop each eye 1 time per day  4.  Remain away from oral antihistamine use secondary to dry eye  5.  Plan for flu vaccine and RSV vaccine  6.  Return to clinic end of December 2023 or earlier if problem  7.  Antibiotic hypersensitivity evaluation in the future  Kayla Cain is really doing much better at this point in time on her current therapy which includes anti-inflammatory agents for airway and therapy directed against LPR and elimination of oral  antihistamines.  She will continue on this plan for full 12 weeks thus I will see her back in this clinic in December 2023 and probably at that point time will be an opportunity to consolidate her medical therapy.  We can work through her distant history of penicillin hypersensitivity at some point in the future.  [name], MD Allergy / Immunology [place]

## 2022-10-01 ENCOUNTER — Encounter: Payer: Self-pay | Admitting: Allergy and Immunology

## 2022-10-16 DIAGNOSIS — M810 Age-related osteoporosis without current pathological fracture: Secondary | ICD-10-CM | POA: Diagnosis not present

## 2022-10-16 DIAGNOSIS — Z23 Encounter for immunization: Secondary | ICD-10-CM | POA: Diagnosis not present

## 2022-10-16 DIAGNOSIS — E538 Deficiency of other specified B group vitamins: Secondary | ICD-10-CM | POA: Diagnosis not present

## 2022-10-21 DIAGNOSIS — H353132 Nonexudative age-related macular degeneration, bilateral, intermediate dry stage: Secondary | ICD-10-CM | POA: Diagnosis not present

## 2022-11-20 DIAGNOSIS — H353132 Nonexudative age-related macular degeneration, bilateral, intermediate dry stage: Secondary | ICD-10-CM | POA: Diagnosis not present

## 2022-11-29 DIAGNOSIS — E538 Deficiency of other specified B group vitamins: Secondary | ICD-10-CM | POA: Diagnosis not present

## 2022-12-05 ENCOUNTER — Ambulatory Visit (INDEPENDENT_AMBULATORY_CARE_PROVIDER_SITE_OTHER): Payer: Medicare Other | Admitting: Allergy and Immunology

## 2022-12-05 ENCOUNTER — Encounter: Payer: Self-pay | Admitting: Allergy and Immunology

## 2022-12-05 VITALS — BP 142/68 | HR 76 | Resp 12

## 2022-12-05 DIAGNOSIS — T50905D Adverse effect of unspecified drugs, medicaments and biological substances, subsequent encounter: Secondary | ICD-10-CM

## 2022-12-05 DIAGNOSIS — J3089 Other allergic rhinitis: Secondary | ICD-10-CM

## 2022-12-05 DIAGNOSIS — H04123 Dry eye syndrome of bilateral lacrimal glands: Secondary | ICD-10-CM | POA: Diagnosis not present

## 2022-12-05 DIAGNOSIS — K219 Gastro-esophageal reflux disease without esophagitis: Secondary | ICD-10-CM | POA: Diagnosis not present

## 2022-12-05 DIAGNOSIS — Z889 Allergy status to unspecified drugs, medicaments and biological substances status: Secondary | ICD-10-CM

## 2022-12-05 MED ORDER — AMOXICILLIN 250 MG/5ML PO SUSR: ORAL | 0 refills | Status: DC

## 2022-12-05 NOTE — Progress Notes (Unsigned)
[place]   Follow-up Note  Referring Provider: Serita Grammes, MD Primary Provider: Serita Grammes, MD Date of Office Visit: 12/05/2022  Subjective:   Kayla Cain (DOB: 11-09-1941) is a 81 y.o. female who returns to the [facility] on 12/05/2022 in re-evaluation of the following:  HPI: Kayla Cain returns to this clinic in evaluation of rhinitis, LPR, dry eye syndrome.  I last saw her in this clinic 27 September 2022.  Although she has had excellent control of her airway issues while utilizing anti-inflammatory agents for her airway and aggressive therapy directed against reflux and is really had no issues with her nose or throat on this plan she still continues to have indigestion commonly and needs to add in Tums.  Her last upper endoscopy was decades ago.  She has received flu vaccine, RSV vaccine, COVID-vaccine.  Allergies as of 12/05/2022       Reactions   Penicillins Rash   All over the body   Doxycycline Hives   Lubiprostone Nausea And Vomiting   Macrolides And Ketolides    GI Intolerance   Erythromycin Nausea Only        Medication List    AMBULATORY NON FORMULARY MEDICATION Mega Red Krill 500 MG  One by mouth once daily   B-12 PO Take by mouth daily as needed.   celecoxib 100 MG capsule Commonly known as: CELEBREX Take 100 mg by mouth daily as needed.   esomeprazole 40 MG capsule Commonly known as: NexIUM Take 1 capsule (40 mg total) by mouth in the morning.   estradiol 0.5 MG tablet Commonly known as: ESTRACE Take 0.5 mg by mouth daily.   famotidine 40 MG tablet Commonly known as: PEPCID Take 1 tablet (40 mg total) by mouth every evening.   montelukast 10 MG tablet Commonly known as: SINGULAIR Take 10 mg by mouth at bedtime.   Prolia 60 MG/ML Sosy injection Generic drug: denosumab SMARTSIG:1 Milliliter(s) SUB-Q Twice a Year   Restasis 0.05 % ophthalmic emulsion Generic drug:  cycloSPORINE   Ryaltris 665-25 MCG/ACT Susp Generic drug: Olopatadine-Mometasone Place 2 sprays into both nostrils 2 (two) times daily as needed.   tretinoin 0.05 % cream Commonly known as: RETIN-A SMARTSIG:sparingly Topical Every Night PRN   valACYclovir 1000 MG tablet Commonly known as: VALTREX Take 1,000 mg by mouth daily.   Vitamin D 1000 units capsule Take 1,000 Units by mouth daily.    Past Medical History:  Diagnosis Date   Allergic rhinitis    Diverticulitis    Diverticulosis    Esophageal stricture    Gastric polyp    GERD (gastroesophageal reflux disease)    Hiatal hernia    Hyperlipidemia    Mitral valve problem    Followed by Dr. Ron Parker briefly.  Did not tolertate B blocker.  Murmur disappeared & now off cardiac meds w/o symptoms    Past Surgical History:  Procedure Laterality Date   COLON SURGERY  08/28/11   lap sigmoid colectomy -diverticulitis   NASAL SEPTUM SURGERY     PARTIAL HYSTERECTOMY     TONSILLECTOMY AND ADENOIDECTOMY      Review of systems negative except as noted in HPI / PMHx or noted below:  Review of Systems  Constitutional: Negative.   HENT: Negative.    Eyes: Negative.   Respiratory: Negative.    Cardiovascular: Negative.   Gastrointestinal: Negative.   Genitourinary: Negative.   Musculoskeletal: Negative.   Skin: Negative.  Neurological: Negative.   Endo/Heme/Allergies: Negative.   Psychiatric/Behavioral: Negative.       Objective:   Vitals:   12/05/22 1017  BP: (!) 142/68  Pulse: 76  Resp: 12  SpO2: 96%          Physical Exam Constitutional:      Appearance: She is not diaphoretic.  HENT:     Head: Normocephalic.     Right Ear: Tympanic membrane, ear canal and external ear normal.     Left Ear: Tympanic membrane, ear canal and external ear normal.     Nose: Nose normal. No mucosal edema or rhinorrhea.     Mouth/Throat:     Pharynx: Uvula midline. No oropharyngeal exudate.  Eyes:     Conjunctiva/sclera:  Conjunctivae normal.  Neck:     Thyroid: No thyromegaly.     Trachea: Trachea normal. No tracheal tenderness or tracheal deviation.  Cardiovascular:     Rate and Rhythm: Normal rate and regular rhythm.     Heart sounds: Normal heart sounds, S1 normal and S2 normal. No murmur heard. Pulmonary:     Effort: No respiratory distress.     Breath sounds: Normal breath sounds. No stridor. No wheezing or rales.  Lymphadenopathy:     Head:     Right side of head: No tonsillar adenopathy.     Left side of head: No tonsillar adenopathy.     Cervical: No cervical adenopathy.  Skin:    Findings: No erythema or rash.     Nails: There is no clubbing.  Neurological:     Mental Status: She is alert.     Diagnostics: none  Assessment and Plan:   1. Perennial allergic rhinitis   2. LPRD (laryngopharyngeal reflux disease)   3. Dry eye syndrome of both eyes    1.  Continue to treat and prevent inflammation:   A. Ryaltris - 2 sprays each nostril 1-2 times per day (SP)  2.  Continue to treat and prevent reflux/LPR:   A. Minimize chocolate consumption  B. Nexium 40 mg - 1 tablet in AM  C. Famotidine 40 mg - 1 tablet in PM  D. Replace all throat clearing with drinking/swallowing maneuver  E. Add tums if needed  F. Arrange for upper endoscopy  3.  If needed:   A. Nasal saline  B. Systane C. Pataday - 1 drop each eye 1 time per day  4.  Remain away from oral antihistamine use secondary to dry eye  5.  Return to clinic in 6 months or earlier if needed.  [name] is doing quite well from a airway standpoint on her current plan of therapy directed against inflammation of her airway and reflux.  However, she still continues to have indigestion and she now needs evaluation with a gastroenterologist.  Will arrange for her to receive an upper endoscopy.  I will see her back in this clinic in 6 months.  As well, we need to work through her penicillin allergy at a point in time in which it is  convenient for her to do so and we can give her an oral amoxicillin challenge in the clinic.  [name], MD Allergy / Immunology [place]

## 2022-12-05 NOTE — Patient Instructions (Signed)
  1.  Continue to treat and prevent inflammation:   A. Ryaltris - 2 sprays each nostril 1-2 times per day (SP)  2.  Continue to treat and prevent reflux/LPR:   A. Minimize chocolate consumption  B. Nexium 40 mg - 1 tablet in AM  C. Famotidine 40 mg - 1 tablet in PM  D. Replace all throat clearing with drinking/swallowing maneuver  E. Add tums if needed  F. Arrange for upper endoscopy  3.  If needed:   A. Nasal saline  B. Systane C. Pataday - 1 drop each eye 1 time per day  4.  Remain away from oral antihistamine use secondary to dry eye  5.  Return to clinic in 6 months or earlier if needed.

## 2022-12-06 ENCOUNTER — Encounter: Payer: Self-pay | Admitting: Allergy and Immunology

## 2022-12-11 ENCOUNTER — Encounter: Payer: Self-pay | Admitting: Family

## 2022-12-11 ENCOUNTER — Ambulatory Visit (INDEPENDENT_AMBULATORY_CARE_PROVIDER_SITE_OTHER): Payer: Medicare Other | Admitting: Family

## 2022-12-11 VITALS — BP 126/72 | HR 80 | Resp 16 | Ht 60.0 in | Wt 139.2 lb

## 2022-12-11 DIAGNOSIS — Z889 Allergy status to unspecified drugs, medicaments and biological substances status: Secondary | ICD-10-CM

## 2022-12-11 DIAGNOSIS — T50905D Adverse effect of unspecified drugs, medicaments and biological substances, subsequent encounter: Secondary | ICD-10-CM

## 2022-12-11 NOTE — Progress Notes (Signed)
[address] [name] [address] Dept: [phone]  FOLLOW UP NOTE  Patient ID: Kayla Cain, female    DOB: 03-02-1941  Age: 82 y.o. MRN: [id number] Date of Office Visit: 12/11/2022  Assessment  Chief Complaint: Food/Drug Challenge (Amoxicillin)  HPI Kayla Cain is an 82 year old female who presents today for an oral food challenge to amoxicillin.  She was last seen on December 05, 2022 by Dr. Neldon Mc for perennial allergic rhinitis, laryngopharyngeal reflux disease, dry eye syndrome, and adverse drug effect.  She denies any new diagnosis or surgery since her last office visit.  Adverse drug: She reports that approximately 50 years ago she developed a rash after receiving a penicillin injection.  She denies any concomitant cardiorespiratory and gastrointestinal symptoms.  She has been off all antihistamines for the past 3 days and reports that she is in good health.  She denies any cardiorespiratory, gastrointestinal, and cutaneous symptoms.  All questions answered and informed consent signed.   Drug Allergies:  Allergies  Allergen Reactions   Doxycycline Hives   Lubiprostone Nausea And Vomiting   Macrolides And Ketolides     GI Intolerance   Erythromycin Nausea Only    Review of Systems: Review of Systems  Constitutional:  Negative for chills and fever.  HENT:         Reports some clear rhinorrhea and nasal congestion. Denies post nasal drip  Eyes:        Denies itchy watery eyes  Respiratory:  Negative for cough, shortness of breath and wheezing.   Cardiovascular:  Negative for chest pain and palpitations.  Gastrointestinal:  Negative for abdominal pain, diarrhea, nausea and vomiting.  Genitourinary:  Negative for frequency.  Skin:  Negative for itching and rash.  Neurological:  Negative for headaches.     Physical Exam: BP 126/72   Pulse 80   Resp 16   Ht 5' (1.524 m)   Wt 139 lb 3.2 oz (63.1 kg)   SpO2 96%   BMI 27.19 kg/m    Physical  Exam Constitutional:      Appearance: Normal appearance.  HENT:     Head: Normocephalic and atraumatic.     Comments: Pharynx normal. Eyes normal. Ears normal. Nose normal    Right Ear: Tympanic membrane, ear canal and external ear normal.     Left Ear: Tympanic membrane, ear canal and external ear normal.     Nose: Nose normal.     Mouth/Throat:     Mouth: Mucous membranes are moist.     Pharynx: Oropharynx is clear.  Eyes:     Conjunctiva/sclera: Conjunctivae normal.  Cardiovascular:     Rate and Rhythm: Normal rate and regular rhythm.     Heart sounds: Normal heart sounds.  Pulmonary:     Effort: Pulmonary effort is normal.     Breath sounds: Normal breath sounds.     Comments: Lungs clear to auscultation Musculoskeletal:     Cervical back: Neck supple.  Skin:    General: Skin is warm.     Comments: No rash or urticarial lesions note.  Neurological:     Mental Status: She is alert and oriented to person, place, and time.  Psychiatric:        Mood and Affect: Mood normal.        Behavior: Behavior normal.        Thought Content: Thought content normal.        Judgment: Judgment normal.     Diagnostics:  Open graded amoxicillin  250 mg/5 mL oral challenge: The patient was able to tolerate the challenge today without adverse signs or symptoms. Vital signs were stable throughout the challenge and observation period. She received multiple doses separated by 30 minutes, each of which was separated by vitals and a brief physical exam. She received the following doses: 0.1 mL, 1 mL, 8.9 mL . She was monitored for 60 minutes following the last dose.  . Therefore, she has the same risk of systemic reaction associated with  amoxicillin  as the general population.   Assessment and Plan: 1. Adverse drug effect, subsequent encounter     No orders of the defined types were placed in this encounter.   Patient Instructions  Kayla Cain was able to tolerate the amoxicillin 250 mg/ 5 mL  challenge today at the office without adverse signs or symptoms of an allergic reaction. Therefore, she has the same risk of systemic reaction associated with amoxicillin products as the general population.  - Do not give any amoxicillin products for the next 24 hours. - Monitor for allergic symptoms such as rash, wheezing, diarrhea, swelling, and vomiting for the next 24 hours. If severe symptoms occur, treat with EpiPen injection and call 911. For less severe symptoms treat with Benadryl 4 teaspoonfuls every 6 hours and call the clinic.  - We will remove the penicillin allergy from your allergy list  Keep already scheduled follow-up appointment on June 27,2024 with Dr. Neldon Mc at 10 AM or sooner if needed    Return in about 6 months (around 06/06/2023), or if symptoms worsen or fail to improve.    Thank you for the opportunity to care for this patient.  Please do not hesitate to contact me with questions.  Althea Charon, FNP [facility]

## 2022-12-11 NOTE — Patient Instructions (Addendum)
Kayla Cain was able to tolerate the amoxicillin 250 mg/ 5 mL challenge today at the office without adverse signs or symptoms of an allergic reaction. Therefore, she has the same risk of systemic reaction associated with amoxicillin products as the general population.  - Do not give any amoxicillin products for the next 24 hours. - Monitor for allergic symptoms such as rash, wheezing, diarrhea, swelling, and vomiting for the next 24 hours. If severe symptoms occur, treat with EpiPen injection and call 911. For less severe symptoms treat with Benadryl 4 teaspoonfuls every 6 hours and call the clinic.  - We will remove the penicillin allergy from your allergy list  Keep already scheduled follow-up appointment on June 27,2024 with Dr. Neldon Mc at 10 AM or sooner if needed

## 2022-12-17 NOTE — Addendum Note (Signed)
Addended by: Guy Franco on: 12/17/2022 06:19 PM   Modules accepted: Orders

## 2022-12-20 DIAGNOSIS — H353132 Nonexudative age-related macular degeneration, bilateral, intermediate dry stage: Secondary | ICD-10-CM | POA: Diagnosis not present

## 2022-12-27 DIAGNOSIS — H10823 Rosacea conjunctivitis, bilateral: Secondary | ICD-10-CM | POA: Diagnosis not present

## 2022-12-27 DIAGNOSIS — H16223 Keratoconjunctivitis sicca, not specified as Sjogren's, bilateral: Secondary | ICD-10-CM | POA: Diagnosis not present

## 2022-12-27 DIAGNOSIS — H04123 Dry eye syndrome of bilateral lacrimal glands: Secondary | ICD-10-CM | POA: Diagnosis not present

## 2022-12-27 DIAGNOSIS — H353131 Nonexudative age-related macular degeneration, bilateral, early dry stage: Secondary | ICD-10-CM | POA: Diagnosis not present

## 2022-12-27 DIAGNOSIS — H524 Presbyopia: Secondary | ICD-10-CM | POA: Diagnosis not present

## 2023-01-19 DIAGNOSIS — H353132 Nonexudative age-related macular degeneration, bilateral, intermediate dry stage: Secondary | ICD-10-CM | POA: Diagnosis not present

## 2023-02-06 DIAGNOSIS — J40 Bronchitis, not specified as acute or chronic: Secondary | ICD-10-CM | POA: Diagnosis not present

## 2023-02-06 DIAGNOSIS — J329 Chronic sinusitis, unspecified: Secondary | ICD-10-CM | POA: Diagnosis not present

## 2023-02-06 DIAGNOSIS — Z6826 Body mass index (BMI) 26.0-26.9, adult: Secondary | ICD-10-CM | POA: Diagnosis not present

## 2023-02-18 DIAGNOSIS — H353132 Nonexudative age-related macular degeneration, bilateral, intermediate dry stage: Secondary | ICD-10-CM | POA: Diagnosis not present

## 2023-02-19 ENCOUNTER — Other Ambulatory Visit: Payer: Self-pay | Admitting: Allergy and Immunology

## 2023-02-20 DIAGNOSIS — B9689 Other specified bacterial agents as the cause of diseases classified elsewhere: Secondary | ICD-10-CM | POA: Diagnosis not present

## 2023-02-20 DIAGNOSIS — E538 Deficiency of other specified B group vitamins: Secondary | ICD-10-CM | POA: Diagnosis not present

## 2023-02-20 DIAGNOSIS — J019 Acute sinusitis, unspecified: Secondary | ICD-10-CM | POA: Diagnosis not present

## 2023-02-20 DIAGNOSIS — J3089 Other allergic rhinitis: Secondary | ICD-10-CM | POA: Diagnosis not present

## 2023-02-21 DIAGNOSIS — E538 Deficiency of other specified B group vitamins: Secondary | ICD-10-CM | POA: Diagnosis not present

## 2023-03-20 DIAGNOSIS — H353132 Nonexudative age-related macular degeneration, bilateral, intermediate dry stage: Secondary | ICD-10-CM | POA: Diagnosis not present

## 2023-03-25 DIAGNOSIS — M3501 Sicca syndrome with keratoconjunctivitis: Secondary | ICD-10-CM | POA: Diagnosis not present

## 2023-03-25 DIAGNOSIS — H353290 Exudative age-related macular degeneration, unspecified eye, stage unspecified: Secondary | ICD-10-CM | POA: Diagnosis not present

## 2023-03-25 DIAGNOSIS — T7840XA Allergy, unspecified, initial encounter: Secondary | ICD-10-CM | POA: Diagnosis not present

## 2023-03-25 DIAGNOSIS — Z79899 Other long term (current) drug therapy: Secondary | ICD-10-CM | POA: Diagnosis not present

## 2023-03-27 DIAGNOSIS — E538 Deficiency of other specified B group vitamins: Secondary | ICD-10-CM | POA: Diagnosis not present

## 2023-04-04 DIAGNOSIS — J329 Chronic sinusitis, unspecified: Secondary | ICD-10-CM | POA: Diagnosis not present

## 2023-04-04 DIAGNOSIS — B9689 Other specified bacterial agents as the cause of diseases classified elsewhere: Secondary | ICD-10-CM | POA: Diagnosis not present

## 2023-04-04 DIAGNOSIS — J40 Bronchitis, not specified as acute or chronic: Secondary | ICD-10-CM | POA: Diagnosis not present

## 2023-04-04 DIAGNOSIS — J028 Acute pharyngitis due to other specified organisms: Secondary | ICD-10-CM | POA: Diagnosis not present

## 2023-04-10 DIAGNOSIS — M810 Age-related osteoporosis without current pathological fracture: Secondary | ICD-10-CM | POA: Diagnosis not present

## 2023-04-15 DIAGNOSIS — Z6825 Body mass index (BMI) 25.0-25.9, adult: Secondary | ICD-10-CM | POA: Diagnosis not present

## 2023-04-15 DIAGNOSIS — B3731 Acute candidiasis of vulva and vagina: Secondary | ICD-10-CM | POA: Diagnosis not present

## 2023-04-15 DIAGNOSIS — J329 Chronic sinusitis, unspecified: Secondary | ICD-10-CM | POA: Diagnosis not present

## 2023-04-15 DIAGNOSIS — J40 Bronchitis, not specified as acute or chronic: Secondary | ICD-10-CM | POA: Diagnosis not present

## 2023-04-19 DIAGNOSIS — H353132 Nonexudative age-related macular degeneration, bilateral, intermediate dry stage: Secondary | ICD-10-CM | POA: Diagnosis not present

## 2023-05-19 DIAGNOSIS — H353132 Nonexudative age-related macular degeneration, bilateral, intermediate dry stage: Secondary | ICD-10-CM | POA: Diagnosis not present

## 2023-05-21 DIAGNOSIS — D2271 Melanocytic nevi of right lower limb, including hip: Secondary | ICD-10-CM | POA: Diagnosis not present

## 2023-05-21 DIAGNOSIS — D2372 Other benign neoplasm of skin of left lower limb, including hip: Secondary | ICD-10-CM | POA: Diagnosis not present

## 2023-05-21 DIAGNOSIS — D692 Other nonthrombocytopenic purpura: Secondary | ICD-10-CM | POA: Diagnosis not present

## 2023-05-21 DIAGNOSIS — B0089 Other herpesviral infection: Secondary | ICD-10-CM | POA: Diagnosis not present

## 2023-05-21 DIAGNOSIS — L821 Other seborrheic keratosis: Secondary | ICD-10-CM | POA: Diagnosis not present

## 2023-05-21 DIAGNOSIS — L738 Other specified follicular disorders: Secondary | ICD-10-CM | POA: Diagnosis not present

## 2023-05-21 DIAGNOSIS — I8391 Asymptomatic varicose veins of right lower extremity: Secondary | ICD-10-CM | POA: Diagnosis not present

## 2023-05-21 DIAGNOSIS — L718 Other rosacea: Secondary | ICD-10-CM | POA: Diagnosis not present

## 2023-05-27 DIAGNOSIS — J029 Acute pharyngitis, unspecified: Secondary | ICD-10-CM | POA: Diagnosis not present

## 2023-05-27 DIAGNOSIS — J0101 Acute recurrent maxillary sinusitis: Secondary | ICD-10-CM | POA: Diagnosis not present

## 2023-05-27 DIAGNOSIS — B9689 Other specified bacterial agents as the cause of diseases classified elsewhere: Secondary | ICD-10-CM | POA: Diagnosis not present

## 2023-05-27 DIAGNOSIS — J028 Acute pharyngitis due to other specified organisms: Secondary | ICD-10-CM | POA: Diagnosis not present

## 2023-06-06 ENCOUNTER — Ambulatory Visit: Payer: Medicare Other | Admitting: Allergy and Immunology

## 2023-06-18 DIAGNOSIS — H353132 Nonexudative age-related macular degeneration, bilateral, intermediate dry stage: Secondary | ICD-10-CM | POA: Diagnosis not present

## 2023-07-09 ENCOUNTER — Other Ambulatory Visit: Payer: Self-pay | Admitting: *Deleted

## 2023-07-09 MED ORDER — RYALTRIS 665-25 MCG/ACT NA SUSP: 2.0000 | Freq: Two times a day (BID) | NASAL | 1 refills | Status: AC | PRN

## 2023-07-10 ENCOUNTER — Ambulatory Visit: Payer: Medicare Other | Admitting: Allergy and Immunology

## 2023-07-12 DIAGNOSIS — E538 Deficiency of other specified B group vitamins: Secondary | ICD-10-CM | POA: Diagnosis not present

## 2023-07-18 DIAGNOSIS — H353132 Nonexudative age-related macular degeneration, bilateral, intermediate dry stage: Secondary | ICD-10-CM | POA: Diagnosis not present

## 2023-08-01 ENCOUNTER — Ambulatory Visit: Payer: Medicare Other | Admitting: Allergy and Immunology

## 2023-08-17 DIAGNOSIS — H353132 Nonexudative age-related macular degeneration, bilateral, intermediate dry stage: Secondary | ICD-10-CM | POA: Diagnosis not present

## 2023-08-26 DIAGNOSIS — Z1231 Encounter for screening mammogram for malignant neoplasm of breast: Secondary | ICD-10-CM | POA: Diagnosis not present

## 2023-08-28 DIAGNOSIS — Z23 Encounter for immunization: Secondary | ICD-10-CM | POA: Diagnosis not present

## 2023-09-04 DIAGNOSIS — M810 Age-related osteoporosis without current pathological fracture: Secondary | ICD-10-CM | POA: Diagnosis not present

## 2023-09-04 DIAGNOSIS — Z79899 Other long term (current) drug therapy: Secondary | ICD-10-CM | POA: Diagnosis not present

## 2023-09-04 DIAGNOSIS — M3501 Sicca syndrome with keratoconjunctivitis: Secondary | ICD-10-CM | POA: Diagnosis not present

## 2023-09-04 DIAGNOSIS — Z131 Encounter for screening for diabetes mellitus: Secondary | ICD-10-CM | POA: Diagnosis not present

## 2023-09-04 DIAGNOSIS — E559 Vitamin D deficiency, unspecified: Secondary | ICD-10-CM | POA: Diagnosis not present

## 2023-09-04 DIAGNOSIS — H353290 Exudative age-related macular degeneration, unspecified eye, stage unspecified: Secondary | ICD-10-CM | POA: Diagnosis not present

## 2023-09-04 DIAGNOSIS — E538 Deficiency of other specified B group vitamins: Secondary | ICD-10-CM | POA: Diagnosis not present

## 2023-09-04 DIAGNOSIS — K5909 Other constipation: Secondary | ICD-10-CM | POA: Diagnosis not present

## 2023-09-07 DIAGNOSIS — Z23 Encounter for immunization: Secondary | ICD-10-CM | POA: Diagnosis not present

## 2023-09-16 DIAGNOSIS — H353132 Nonexudative age-related macular degeneration, bilateral, intermediate dry stage: Secondary | ICD-10-CM | POA: Diagnosis not present

## 2023-09-23 DIAGNOSIS — J40 Bronchitis, not specified as acute or chronic: Secondary | ICD-10-CM | POA: Diagnosis not present

## 2023-09-23 DIAGNOSIS — J329 Chronic sinusitis, unspecified: Secondary | ICD-10-CM | POA: Diagnosis not present

## 2023-10-09 DIAGNOSIS — Z1212 Encounter for screening for malignant neoplasm of rectum: Secondary | ICD-10-CM | POA: Diagnosis not present

## 2023-10-09 DIAGNOSIS — Z1211 Encounter for screening for malignant neoplasm of colon: Secondary | ICD-10-CM | POA: Diagnosis not present

## 2023-10-16 DIAGNOSIS — H353132 Nonexudative age-related macular degeneration, bilateral, intermediate dry stage: Secondary | ICD-10-CM | POA: Diagnosis not present

## 2023-11-15 DIAGNOSIS — H353132 Nonexudative age-related macular degeneration, bilateral, intermediate dry stage: Secondary | ICD-10-CM | POA: Diagnosis not present

## 2023-11-20 DIAGNOSIS — J0100 Acute maxillary sinusitis, unspecified: Secondary | ICD-10-CM | POA: Diagnosis not present

## 2023-11-20 DIAGNOSIS — E538 Deficiency of other specified B group vitamins: Secondary | ICD-10-CM | POA: Diagnosis not present

## 2023-11-20 DIAGNOSIS — Z6825 Body mass index (BMI) 25.0-25.9, adult: Secondary | ICD-10-CM | POA: Diagnosis not present

## 2023-11-20 DIAGNOSIS — J3489 Other specified disorders of nose and nasal sinuses: Secondary | ICD-10-CM | POA: Diagnosis not present

## 2023-12-15 DIAGNOSIS — H353132 Nonexudative age-related macular degeneration, bilateral, intermediate dry stage: Secondary | ICD-10-CM | POA: Diagnosis not present

## 2024-01-14 DIAGNOSIS — H353132 Nonexudative age-related macular degeneration, bilateral, intermediate dry stage: Secondary | ICD-10-CM | POA: Diagnosis not present

## 2024-02-13 DIAGNOSIS — H353132 Nonexudative age-related macular degeneration, bilateral, intermediate dry stage: Secondary | ICD-10-CM | POA: Diagnosis not present

## 2024-03-02 DIAGNOSIS — E538 Deficiency of other specified B group vitamins: Secondary | ICD-10-CM | POA: Diagnosis not present

## 2024-03-02 DIAGNOSIS — M810 Age-related osteoporosis without current pathological fracture: Secondary | ICD-10-CM | POA: Diagnosis not present

## 2024-03-02 DIAGNOSIS — E782 Mixed hyperlipidemia: Secondary | ICD-10-CM | POA: Diagnosis not present

## 2024-03-02 DIAGNOSIS — E559 Vitamin D deficiency, unspecified: Secondary | ICD-10-CM | POA: Diagnosis not present

## 2024-03-04 DIAGNOSIS — Z961 Presence of intraocular lens: Secondary | ICD-10-CM | POA: Diagnosis not present

## 2024-03-04 DIAGNOSIS — H353131 Nonexudative age-related macular degeneration, bilateral, early dry stage: Secondary | ICD-10-CM | POA: Diagnosis not present

## 2024-03-04 DIAGNOSIS — H16223 Keratoconjunctivitis sicca, not specified as Sjogren's, bilateral: Secondary | ICD-10-CM | POA: Diagnosis not present

## 2024-03-04 DIAGNOSIS — H43393 Other vitreous opacities, bilateral: Secondary | ICD-10-CM | POA: Diagnosis not present

## 2024-03-06 DIAGNOSIS — Z79899 Other long term (current) drug therapy: Secondary | ICD-10-CM | POA: Diagnosis not present

## 2024-03-14 DIAGNOSIS — H353132 Nonexudative age-related macular degeneration, bilateral, intermediate dry stage: Secondary | ICD-10-CM | POA: Diagnosis not present

## 2024-04-13 DIAGNOSIS — H353132 Nonexudative age-related macular degeneration, bilateral, intermediate dry stage: Secondary | ICD-10-CM | POA: Diagnosis not present

## 2024-04-16 DIAGNOSIS — M810 Age-related osteoporosis without current pathological fracture: Secondary | ICD-10-CM | POA: Diagnosis not present

## 2024-05-12 DIAGNOSIS — M25522 Pain in left elbow: Secondary | ICD-10-CM | POA: Diagnosis not present

## 2024-05-13 DIAGNOSIS — H353132 Nonexudative age-related macular degeneration, bilateral, intermediate dry stage: Secondary | ICD-10-CM | POA: Diagnosis not present

## 2024-05-18 DIAGNOSIS — Z6825 Body mass index (BMI) 25.0-25.9, adult: Secondary | ICD-10-CM | POA: Diagnosis not present

## 2024-05-18 DIAGNOSIS — J40 Bronchitis, not specified as acute or chronic: Secondary | ICD-10-CM | POA: Diagnosis not present

## 2024-05-18 DIAGNOSIS — J329 Chronic sinusitis, unspecified: Secondary | ICD-10-CM | POA: Diagnosis not present

## 2024-05-20 DIAGNOSIS — M25522 Pain in left elbow: Secondary | ICD-10-CM | POA: Diagnosis not present

## 2024-05-25 DIAGNOSIS — M25522 Pain in left elbow: Secondary | ICD-10-CM | POA: Diagnosis not present

## 2024-06-12 DIAGNOSIS — H353132 Nonexudative age-related macular degeneration, bilateral, intermediate dry stage: Secondary | ICD-10-CM | POA: Diagnosis not present

## 2024-06-23 DIAGNOSIS — D485 Neoplasm of uncertain behavior of skin: Secondary | ICD-10-CM | POA: Diagnosis not present

## 2024-06-23 DIAGNOSIS — D2372 Other benign neoplasm of skin of left lower limb, including hip: Secondary | ICD-10-CM | POA: Diagnosis not present

## 2024-06-23 DIAGNOSIS — L821 Other seborrheic keratosis: Secondary | ICD-10-CM | POA: Diagnosis not present

## 2024-06-23 DIAGNOSIS — L570 Actinic keratosis: Secondary | ICD-10-CM | POA: Diagnosis not present

## 2024-06-23 DIAGNOSIS — L820 Inflamed seborrheic keratosis: Secondary | ICD-10-CM | POA: Diagnosis not present

## 2024-06-23 DIAGNOSIS — L718 Other rosacea: Secondary | ICD-10-CM | POA: Diagnosis not present

## 2024-07-12 DIAGNOSIS — H353132 Nonexudative age-related macular degeneration, bilateral, intermediate dry stage: Secondary | ICD-10-CM | POA: Diagnosis not present

## 2024-08-11 DIAGNOSIS — H353132 Nonexudative age-related macular degeneration, bilateral, intermediate dry stage: Secondary | ICD-10-CM | POA: Diagnosis not present

## 2024-09-10 DIAGNOSIS — H353132 Nonexudative age-related macular degeneration, bilateral, intermediate dry stage: Secondary | ICD-10-CM | POA: Diagnosis not present

## 2024-09-21 DIAGNOSIS — L820 Inflamed seborrheic keratosis: Secondary | ICD-10-CM | POA: Diagnosis not present

## 2024-09-22 DIAGNOSIS — R0981 Nasal congestion: Secondary | ICD-10-CM | POA: Diagnosis not present

## 2024-09-22 DIAGNOSIS — J40 Bronchitis, not specified as acute or chronic: Secondary | ICD-10-CM | POA: Diagnosis not present

## 2024-09-22 DIAGNOSIS — E538 Deficiency of other specified B group vitamins: Secondary | ICD-10-CM | POA: Diagnosis not present

## 2024-09-22 DIAGNOSIS — J329 Chronic sinusitis, unspecified: Secondary | ICD-10-CM | POA: Diagnosis not present

## 2024-10-10 DIAGNOSIS — H353132 Nonexudative age-related macular degeneration, bilateral, intermediate dry stage: Secondary | ICD-10-CM | POA: Diagnosis not present

## 2024-10-27 DIAGNOSIS — Z131 Encounter for screening for diabetes mellitus: Secondary | ICD-10-CM | POA: Diagnosis not present

## 2024-10-27 DIAGNOSIS — E559 Vitamin D deficiency, unspecified: Secondary | ICD-10-CM | POA: Diagnosis not present

## 2024-10-27 DIAGNOSIS — H353290 Exudative age-related macular degeneration, unspecified eye, stage unspecified: Secondary | ICD-10-CM | POA: Diagnosis not present

## 2024-10-27 DIAGNOSIS — E049 Nontoxic goiter, unspecified: Secondary | ICD-10-CM | POA: Diagnosis not present

## 2024-10-27 DIAGNOSIS — Z79899 Other long term (current) drug therapy: Secondary | ICD-10-CM | POA: Diagnosis not present

## 2024-10-27 DIAGNOSIS — E782 Mixed hyperlipidemia: Secondary | ICD-10-CM | POA: Diagnosis not present

## 2024-10-27 DIAGNOSIS — Z23 Encounter for immunization: Secondary | ICD-10-CM | POA: Diagnosis not present

## 2024-10-27 DIAGNOSIS — E538 Deficiency of other specified B group vitamins: Secondary | ICD-10-CM | POA: Diagnosis not present

## 2024-10-27 DIAGNOSIS — Z0000 Encounter for general adult medical examination without abnormal findings: Secondary | ICD-10-CM | POA: Diagnosis not present

## 2024-11-09 DIAGNOSIS — H353132 Nonexudative age-related macular degeneration, bilateral, intermediate dry stage: Secondary | ICD-10-CM | POA: Diagnosis not present

## 2024-11-13 DIAGNOSIS — E049 Nontoxic goiter, unspecified: Secondary | ICD-10-CM | POA: Diagnosis not present

## 2024-11-16 NOTE — Progress Notes (Deleted)
 Cardiology Office Note:    Date:  11/21/2024   ID:  Kayla Cain, DOB 1941-01-01, MRN [id number]  PCP:  Clemmie Nest, MD  Cardiologist:  Redell Leiter, MD   Referring MD: Clemmie Nest, MD  ASSESSMENT:    1. SOB (shortness of breath)   2. Mixed hyperlipidemia    PLAN:    In order of problems listed above:  ***  Next appointment   Medication Adjustments/Labs and Tests Ordered: Current medicines are reviewed at length with the patient today.  Concerns regarding medicines are outlined above.  No orders of the defined types were placed in this encounter.  No orders of the defined types were placed in this encounter.    No chief complaint on file. ***  History of Present Illness:    Kayla Cain is a 83 y.o. female with a history of allergic rhinitis and laryngal pharyngeal reflux disease who is being seen today for the evaluation of shortness of breath at the request of Clemmie Nest, MD.  She was seen with her primary care physician 10/27/2024.  Blood pressure 126/62 heart rate 76 bpm her problems include dyslipidemia thyroid  goiter B12 deficiency and shortness of breath with physical activity with concern for diastolic heart failure.  Laboratory test included a normal A1c 5.6% hemoglobin 14.8 creatinine 0.6 with a normal GFR and sodium 137 potassium 4.3 cholesterol 184 LDL 102 non-HDL cholesterol elevated 128 TSH was normal. Past Medical History:  Diagnosis Date   ABDOMINAL PAIN RIGHT LOWER QUADRANT 10/10/2009   Qualifier: Diagnosis of   By: Obie MD, Princella HERO        ABDOMINAL PAIN, EPIGASTRIC 10/10/2009   Qualifier: Diagnosis of   By: Obie MD, Dora M        Allergic rhinitis    Diaphragmatic hernia 12/07/2009   Qualifier: Diagnosis of   By: Marcelo CMA LEODIS), Dottie      IMO SNOMED Dx Update Oct 2024     Diverticulitis    Diverticulosis    Diverticulosis of colon 12/07/2009   Qualifier: Diagnosis of   By: Marcelo CMA LEODIS), Dottie      IMO  SNOMED Dx Update Oct 2024     Esophageal stricture    ESOPHAGEAL STRICTURE 12/07/2009   Qualifier: History of   By: Marcelo CMA (AAMA), Dottie         Gastric polyp    GERD 10/27/2009   Qualifier: Diagnosis of   By: Vernell LPN, Deborah         GERD (gastroesophageal reflux disease)    Hiatal hernia    Hyperlipidemia    Low back pain 06/01/2022   Mitral valve disease 10/10/2009   Qualifier: Diagnosis of   By: Obie MD, Princella HERO     IMO SNOMED Dx Update Oct 2024     Mitral valve problem    Followed by Dr. Micky briefly.  Did not tolertate B blocker.  Murmur disappeared & now off cardiac meds w/o symptoms   Neurogenic claudication 03/18/2022   Trochanteric bursitis of right hip 06/01/2022    Past Surgical History:  Procedure Laterality Date   COLON SURGERY  08/28/11   lap sigmoid colectomy -diverticulitis   NASAL SEPTUM SURGERY     PARTIAL HYSTERECTOMY     TONSILLECTOMY AND ADENOIDECTOMY      Current Medications: No outpatient medications have been marked as taking for the 11/23/24 encounter (Appointment) with Leiter Redell PARAS, MD.     Allergies:   Doxycycline, Lubiprostone, Macrolides and ketolides, and Erythromycin  Social History   Socioeconomic History   Marital status: Single    Spouse name: Not on file   Number of children: Not on file   Years of education: Not on file   Highest education level: Not on file  Occupational History   Occupation: [employer] Administrator   Tobacco Use   Smoking status: Never   Smokeless tobacco: Never  Substance and Sexual Activity   Alcohol use: Yes    Comment: rare    Drug use: No   Sexual activity: Not on file  Other Topics Concern   Not on file  Social History Narrative   Not on file   Social Drivers of Health   Tobacco Use: Low Risk (11/20/2024)   Patient History    Smoking Tobacco Use: Never    Smokeless Tobacco Use: Never    Passive Exposure: Not on file  Financial Resource Strain: Not on file  Food Insecurity: Not on file   Transportation Needs: Not on file  Physical Activity: Not on file  Stress: Not on file  Social Connections: Not on file  Depression (EYV7-0): Not on file  Alcohol Screen: Not on file  Housing: Not on file  Utilities: Not on file  Health Literacy: Not on file     Family History: The patient's ***family history includes Breast cancer in her mother; Cancer in her mother; Heart disease in her brother; Liver disease in her brother; Prostate cancer in her brother. There is no history of Colon cancer.  ROS:   ROS Please see the history of present illness.    *** All other systems reviewed and are negative.  EKGs/Labs/Other Studies Reviewed:    The following studies were reviewed today: ***      EKG:  EKG is *** ordered today.  The ekg ordered today is personally reviewed and demonstrates ***  Recent Labs: No results found for requested labs within last 365 days.  Recent Lipid Panel No results found for: CHOL, TRIG, HDL, CHOLHDL, VLDL, LDLCALC, LDLDIRECT  Physical Exam:    VS:  There were no vitals taken for this visit.    Wt Readings from Last 3 Encounters:  12/11/22 139 lb 3.2 oz (63.1 kg)  08/29/22 138 lb (62.6 kg)  10/03/11 134 lb 12.8 oz (61.1 kg)     GEN: *** Well nourished, well developed in no acute distress HEENT: Normal NECK: No JVD; No carotid bruits LYMPHATICS: No lymphadenopathy CARDIAC: ***RRR, no murmurs, rubs, gallops RESPIRATORY:  Clear to auscultation without rales, wheezing or rhonchi  ABDOMEN: Soft, non-tender, non-distended MUSCULOSKELETAL:  No edema; No deformity  SKIN: Warm and dry NEUROLOGIC:  Alert and oriented x 3 PSYCHIATRIC:  Normal affect     Signed, Redell Leiter, MD  11/21/2024 10:03 AM    [facility]

## 2024-11-20 DIAGNOSIS — K222 Esophageal obstruction: Secondary | ICD-10-CM | POA: Insufficient documentation

## 2024-11-20 DIAGNOSIS — K449 Diaphragmatic hernia without obstruction or gangrene: Secondary | ICD-10-CM | POA: Insufficient documentation

## 2024-11-20 DIAGNOSIS — J309 Allergic rhinitis, unspecified: Secondary | ICD-10-CM | POA: Insufficient documentation

## 2024-11-20 DIAGNOSIS — E785 Hyperlipidemia, unspecified: Secondary | ICD-10-CM | POA: Insufficient documentation

## 2024-11-20 DIAGNOSIS — K317 Polyp of stomach and duodenum: Secondary | ICD-10-CM | POA: Insufficient documentation

## 2024-11-20 DIAGNOSIS — I059 Rheumatic mitral valve disease, unspecified: Secondary | ICD-10-CM | POA: Insufficient documentation

## 2024-11-20 DIAGNOSIS — K5790 Diverticulosis of intestine, part unspecified, without perforation or abscess without bleeding: Secondary | ICD-10-CM | POA: Insufficient documentation

## 2024-11-20 DIAGNOSIS — K219 Gastro-esophageal reflux disease without esophagitis: Secondary | ICD-10-CM | POA: Insufficient documentation

## 2024-11-20 DIAGNOSIS — K5792 Diverticulitis of intestine, part unspecified, without perforation or abscess without bleeding: Secondary | ICD-10-CM | POA: Insufficient documentation

## 2024-11-23 ENCOUNTER — Ambulatory Visit: Admitting: Cardiology
